# Patient Record
Sex: Female | Born: 1970 | Race: Black or African American | Hispanic: No | Smoking: Never smoker
Health system: Southern US, Community
[De-identification: ages and names within clinical notes are randomized; demographics above are authoritative.]

## PROBLEM LIST (undated history)

## (undated) DIAGNOSIS — T7840XA Allergy, unspecified, initial encounter: Secondary | ICD-10-CM

## (undated) DIAGNOSIS — R51 Headache: Secondary | ICD-10-CM

## (undated) DIAGNOSIS — R519 Headache, unspecified: Secondary | ICD-10-CM

## (undated) HISTORY — DX: Headache: R51

## (undated) HISTORY — PX: ABDOMINAL HYSTERECTOMY: SHX81

## (undated) HISTORY — DX: Headache, unspecified: R51.9

## (undated) HISTORY — DX: Allergy, unspecified, initial encounter: T78.40XA

---

## 1996-02-08 HISTORY — PX: ECTOPIC PREGNANCY SURGERY: SHX613

## 1998-02-07 HISTORY — PX: TUBAL LIGATION: SHX77

## 2001-08-28 ENCOUNTER — Emergency Department (HOSPITAL_COMMUNITY): Admission: EM | Admit: 2001-08-28 | Discharge: 2001-08-28 | Payer: Self-pay | Admitting: *Deleted

## 2004-12-14 ENCOUNTER — Emergency Department (HOSPITAL_COMMUNITY): Admission: EM | Admit: 2004-12-14 | Discharge: 2004-12-14 | Payer: Self-pay | Admitting: Gastroenterology

## 2014-10-10 ENCOUNTER — Emergency Department (HOSPITAL_COMMUNITY): Payer: Self-pay

## 2014-10-10 ENCOUNTER — Emergency Department (HOSPITAL_COMMUNITY)
Admission: EM | Admit: 2014-10-10 | Discharge: 2014-10-10 | Disposition: A | Discharge status: Home / Self Care | Payer: Self-pay | Attending: Emergency Medicine | Admitting: Emergency Medicine

## 2014-10-10 ENCOUNTER — Encounter (HOSPITAL_COMMUNITY): Payer: Self-pay | Admitting: *Deleted

## 2014-10-10 DIAGNOSIS — N898 Other specified noninflammatory disorders of vagina: Secondary | ICD-10-CM | POA: Insufficient documentation

## 2014-10-10 DIAGNOSIS — R102 Pelvic and perineal pain unspecified side: Secondary | ICD-10-CM

## 2014-10-10 DIAGNOSIS — Z3202 Encounter for pregnancy test, result negative: Secondary | ICD-10-CM | POA: Insufficient documentation

## 2014-10-10 LAB — URINALYSIS, ROUTINE W REFLEX MICROSCOPIC
Bilirubin Urine: NEGATIVE
Glucose, UA: NEGATIVE mg/dL
Ketones, ur: NEGATIVE mg/dL
Nitrite: NEGATIVE
Protein, ur: NEGATIVE mg/dL
Specific Gravity, Urine: 1.013 (ref 1.005–1.030)
UROBILINOGEN UA: 0.2 mg/dL (ref 0.0–1.0)
pH: 6 (ref 5.0–8.0)

## 2014-10-10 LAB — URINE MICROSCOPIC-ADD ON

## 2014-10-10 LAB — WET PREP, GENITAL
Trich, Wet Prep: NONE SEEN
Yeast Wet Prep HPF POC: NONE SEEN

## 2014-10-10 LAB — PREGNANCY, URINE: Preg Test, Ur: NEGATIVE

## 2014-10-10 MED ORDER — OXYCODONE-ACETAMINOPHEN 5-325 MG PO TABS
ORAL_TABLET | ORAL | Status: AC
  Filled 2014-10-10: qty 1

## 2014-10-10 MED ORDER — CEFTRIAXONE SODIUM 250 MG IJ SOLR
250.0000 mg | Freq: Once | INTRAMUSCULAR | Status: AC
  Administered 2014-10-10: 250 mg via INTRAMUSCULAR
  Filled 2014-10-10: qty 250

## 2014-10-10 MED ORDER — LIDOCAINE HCL 2 % IJ SOLN
5.0000 mL | Freq: Once | INTRAMUSCULAR | Status: AC
  Administered 2014-10-10: 100 mg via INTRADERMAL
  Filled 2014-10-10: qty 20

## 2014-10-10 MED ORDER — METRONIDAZOLE 500 MG PO TABS: 500.0000 mg | ORAL_TABLET | Freq: Two times a day (BID) | ORAL | Status: DC

## 2014-10-10 MED ORDER — AZITHROMYCIN 250 MG PO TABS
1000.0000 mg | ORAL_TABLET | Freq: Once | ORAL | Status: AC
  Administered 2014-10-10: 1000 mg via ORAL
  Filled 2014-10-10: qty 4

## 2014-10-10 MED ORDER — OXYCODONE-ACETAMINOPHEN 5-325 MG PO TABS
1.0000 | ORAL_TABLET | Freq: Once | ORAL | Status: AC
  Administered 2014-10-10: 1 via ORAL

## 2014-10-10 MED ORDER — DOXYCYCLINE HYCLATE 100 MG PO CAPS: 100.0000 mg | ORAL_CAPSULE | Freq: Two times a day (BID) | ORAL | Status: DC

## 2014-10-10 NOTE — Discharge Instructions (Signed)
It was our pleasure to provide your ER care today - we hope that you feel better.  Take motrin or aleve as need for pain.  Take antibiotics (doxycycline and flagyl) as prescribed.  Do not drink alcohol when taking these medications.  Follow up with primary care doctor in the next few days for recheck if symptoms fail to improve/resolve.  Return to ER if worse, new symptoms, fevers, worsening or severe pain, persistent vomiting, other concern.  You were given pain medication in the ER - no driving for the next 4 hours.    Pelvic Pain Female pelvic pain can be caused by many different things and start from a variety of places. Pelvic pain refers to pain that is located in the lower half of the abdomen and between your hips. The pain may occur over a short period of time (acute) or may be reoccurring (chronic). The cause of pelvic pain may be related to disorders affecting the female reproductive organs (gynecologic), but it may also be related to the bladder, kidney stones, an intestinal complication, or muscle or skeletal problems. Getting help right away for pelvic pain is important, especially if there has been severe, sharp, or a sudden onset of unusual pain. It is also important to get help right away because some types of pelvic pain can be life threatening.  CAUSES  Below are only some of the causes of pelvic pain. The causes of pelvic pain can be in one of several categories.   Gynecologic.  Pelvic inflammatory disease.  Sexually transmitted infection.  Ovarian cyst or a twisted ovarian ligament (ovarian torsion).  Uterine lining that grows outside the uterus (endometriosis).  Fibroids, cysts, or tumors.  Ovulation.  Pregnancy.  Pregnancy that occurs outside the uterus (ectopic pregnancy).  Miscarriage.  Labor.  Abruption of the placenta or ruptured uterus.  Infection.  Uterine infection (endometritis).  Bladder infection.  Diverticulitis.  Miscarriage related  to a uterine infection (septic abortion).  Bladder.  Inflammation of the bladder (cystitis).  Kidney stone(s).  Gastrointestinal.  Constipation.  Diverticulitis.  Neurologic.  Trauma.  Feeling pelvic pain because of mental or emotional causes (psychosomatic).  Cancers of the bowel or pelvis. EVALUATION  Your caregiver will want to take a careful history of your concerns. This includes recent changes in your health, a careful gynecologic history of your periods (menses), and a sexual history. Obtaining your family history and medical history is also important. Your caregiver may suggest a pelvic exam. A pelvic exam will help identify the location and severity of the pain. It also helps in the evaluation of which organ system may be involved. In order to identify the cause of the pelvic pain and be properly treated, your caregiver may order tests. These tests may include:   A pregnancy test.  Pelvic ultrasonography.  An X-ray exam of the abdomen.  A urinalysis or evaluation of vaginal discharge.  Blood tests. HOME CARE INSTRUCTIONS   Only take over-the-counter or prescription medicines for pain, discomfort, or fever as directed by your caregiver.   Rest as directed by your caregiver.   Eat a balanced diet.   Drink enough fluids to make your urine clear or pale yellow, or as directed.   Avoid sexual intercourse if it causes pain.   Apply warm or cold compresses to the lower abdomen depending on which one helps the pain.   Avoid stressful situations.   Keep a journal of your pelvic pain. Write down when it started, where the pain  is located, and if there are things that seem to be associated with the pain, such as food or your menstrual cycle.  Follow up with your caregiver as directed.  SEEK MEDICAL CARE IF:  Your medicine does not help your pain.  You have abnormal vaginal discharge. SEEK IMMEDIATE MEDICAL CARE IF:   You have heavy bleeding from the  vagina.   Your pelvic pain increases.   You feel light-headed or faint.   You have chills.   You have pain with urination or blood in your urine.   You have uncontrolled diarrhea or vomiting.   You have a fever or persistent symptoms for more than 3 days.  You have a fever and your symptoms suddenly get worse.   You are being physically or sexually abused.  MAKE SURE YOU:  Understand these instructions.  Will watch your condition.  Will get help if you are not doing well or get worse. Document Released: 12/22/2003 Document Revised: 06/10/2013 Document Reviewed: 05/16/2011 [facility] Patient Information 2015 [place], [place]. This information is not intended to replace advice given to you by your health care provider. Make sure you discuss any questions you have with your health care provider.

## 2014-10-10 NOTE — ED Notes (Signed)
Pt reports lower abd pain x 2 days with nausea and vaginal discharge. Denies urinary symptoms.

## 2014-10-10 NOTE — ED Provider Notes (Addendum)
CSN: [id number]     Arrival date & time 10/10/14  1315 History   First MD Initiated Contact with Patient 10/10/14 1404     Chief Complaint  Patient presents with  . Vaginal Discharge  . Abdominal Pain     (Consider location/radiation/quality/duration/timing/severity/associated sxs/prior Treatment) Patient is a 44 y.o. female presenting with vaginal discharge and abdominal pain. The history is provided by the patient.  Vaginal Discharge Associated symptoms: abdominal pain   Associated symptoms: no dysuria, no fever and no vomiting   Abdominal Pain Associated symptoms: vaginal discharge   Associated symptoms: no chest pain, no chills, no constipation, no diarrhea, no dysuria, no fever, no hematuria, no shortness of breath, no sore throat, no vaginal bleeding and no vomiting   Patient c/o sharp, pinching pain across bilateral lower abdomen for the past couple days. Pain constant, moderate, non radiating, without specific exacerbating or alleviating factors. No fever or chills. No dysuria, frequency, or hematuria.  lnmp 1 week ago. Mild vaginal discharge currently, malodor.   Hx fibroids. No hx ovarian cysts or endometriosis. Normal appetite. Mild nausea earlier. No vomiting. Having normal bms, no diarrhea or constipation. No prior abd or pelvic surgery. +sexually active, no contrac x prior tubal ligation.        History reviewed. No pertinent past medical history. History reviewed. No pertinent past surgical history. History reviewed. No pertinent family history. Social History  Substance Use Topics  . Smoking status: Never Smoker   . Smokeless tobacco: None  . Alcohol Use: No   OB History    No data available     Review of Systems  Constitutional: Negative for fever and chills.  HENT: Negative for sore throat.   Eyes: Negative for redness.  Respiratory: Negative for shortness of breath.   Cardiovascular: Negative for chest pain.  Gastrointestinal: Positive for abdominal pain.  Negative for vomiting, diarrhea and constipation.  Endocrine: Negative for polyuria.  Genitourinary: Positive for vaginal discharge. Negative for dysuria, hematuria, flank pain and vaginal bleeding.  Musculoskeletal: Negative for back pain and neck pain.  Skin: Negative for rash.  Neurological: Negative for headaches.  Hematological: Does not bruise/bleed easily.  Psychiatric/Behavioral: Negative for confusion.      Allergies  Review of patient's allergies indicates no known allergies.  Home Medications   Prior to Admission medications   Not on File   BP 148/99 mmHg  Pulse 85  Temp(Src) 98.1 F (36.7 C) (Oral)  Resp 18  Ht 5\' 6"  (1.676 m)  Wt 198 lb (89.812 kg)  BMI 31.97 kg/m2  SpO2 100%  LMP 10/02/2014 Physical Exam  Constitutional: She appears well-developed and well-nourished. No distress.  HENT:  Mouth/Throat: Oropharynx is clear and moist.  Eyes: Conjunctivae are normal. No scleral icterus.  Neck: Neck supple. No tracheal deviation present.  Cardiovascular: Normal rate, regular rhythm, normal heart sounds and intact distal pulses.   Pulmonary/Chest: Effort normal and breath sounds normal. No respiratory distress.  Abdominal: Soft. Normal appearance and bowel sounds are normal. She exhibits no distension and no mass. There is tenderness. There is no rebound and no guarding.  Bilateral pelvic area tenderness. No rebound or guarding. No focal rlq tenderness.   Genitourinary:  No cva tenderness. Normal external genitalia. Moderate, whitish-yellow vaginal discharge w mild malodor. +cervicitis. Cervix closed. Mild-mod CMT.  no focal or unilateral adnexal masses/tenderness.   Musculoskeletal: She exhibits no edema.  Neurological: She is alert.  Skin: Skin is warm and dry. No rash noted. She is not diaphoretic.  Psychiatric: She has a normal mood and affect.  Nursing note and vitals reviewed.   ED Course  Procedures (including critical care time) Labs  Review  Results for orders placed or performed during the hospital encounter of 10/10/14  Wet prep, genital  Result Value Ref Range   Yeast Wet Prep HPF POC NONE SEEN NONE SEEN   Trich, Wet Prep NONE SEEN NONE SEEN   Clue Cells Wet Prep HPF POC FEW (A) NONE SEEN   WBC, Wet Prep HPF POC TOO NUMEROUS TO COUNT (A) NONE SEEN  Urinalysis, Routine w reflex microscopic (not at [facility])  Result Value Ref Range   Color, Urine YELLOW YELLOW   APPearance CLEAR CLEAR   Specific Gravity, Urine 1.013 1.005 - 1.030   pH 6.0 5.0 - 8.0   Glucose, UA NEGATIVE NEGATIVE mg/dL   Hgb urine dipstick TRACE (A) NEGATIVE   Bilirubin Urine NEGATIVE NEGATIVE   Ketones, ur NEGATIVE NEGATIVE mg/dL   Protein, ur NEGATIVE NEGATIVE mg/dL   Urobilinogen, UA 0.2 0.0 - 1.0 mg/dL   Nitrite NEGATIVE NEGATIVE   Leukocytes, UA SMALL (A) NEGATIVE  Urine microscopic-add on  Result Value Ref Range   Squamous Epithelial / LPF FEW (A) RARE   WBC, UA 7-10 <3 WBC/hpf   RBC / HPF 3-6 <3 RBC/hpf   Bacteria, UA FEW (A) RARE  Pregnancy, urine  Result Value Ref Range   Preg Test, Ur NEGATIVE NEGATIVE      I have personally reviewed and evaluated these images and lab results as part of my medical decision-making.    MDM   Labs.  Reviewed nursing notes and prior charts for additional history.   u preg neg.   Vaginal swabs sent. Rocephin im, zithromax po.  U/s pending.  Wet prep positive for clue cells and many wbc - will rx flagyl.   U/s pending - signed out to oncoming provider, Dr Maryan Rued, to check u/s result when back.    If u/s negative, would rx w doxy and flagyl.  If positive, tx accordingly.      Lajean Saver, MD 10/10/14 [phone]

## 2014-10-10 NOTE — ED Notes (Signed)
Pt off unit with US 

## 2014-10-14 LAB — GC/CHLAMYDIA PROBE AMP (~~LOC~~) NOT AT ARMC
CHLAMYDIA, DNA PROBE: NEGATIVE
Neisseria Gonorrhea: NEGATIVE

## 2015-12-22 ENCOUNTER — Other Ambulatory Visit: Payer: Self-pay | Admitting: Obstetrics and Gynecology

## 2015-12-22 LAB — HM PAP SMEAR

## 2015-12-23 LAB — CYTOLOGY - PAP

## 2015-12-25 ENCOUNTER — Other Ambulatory Visit: Payer: Self-pay | Admitting: Obstetrics and Gynecology

## 2015-12-25 DIAGNOSIS — R928 Other abnormal and inconclusive findings on diagnostic imaging of breast: Secondary | ICD-10-CM

## 2016-01-08 ENCOUNTER — Ambulatory Visit
Admission: RE | Admit: 2016-01-08 | Discharge: 2016-01-08 | Disposition: A | Discharge status: Home / Self Care | Payer: 59 | Source: Ambulatory Visit | Attending: Obstetrics and Gynecology | Admitting: Obstetrics and Gynecology

## 2016-01-08 DIAGNOSIS — R928 Other abnormal and inconclusive findings on diagnostic imaging of breast: Secondary | ICD-10-CM

## 2016-03-16 ENCOUNTER — Encounter: Payer: Self-pay | Admitting: Physician Assistant

## 2016-03-16 ENCOUNTER — Ambulatory Visit (INDEPENDENT_AMBULATORY_CARE_PROVIDER_SITE_OTHER): Payer: 59 | Admitting: Physician Assistant

## 2016-03-16 VITALS — BP 126/90 | HR 72 | Temp 98.1°F | Resp 20 | Ht 66.5 in | Wt 194.2 lb

## 2016-03-16 DIAGNOSIS — R110 Nausea: Secondary | ICD-10-CM

## 2016-03-16 DIAGNOSIS — R519 Headache, unspecified: Secondary | ICD-10-CM

## 2016-03-16 DIAGNOSIS — R42 Dizziness and giddiness: Secondary | ICD-10-CM

## 2016-03-16 DIAGNOSIS — R51 Headache: Secondary | ICD-10-CM

## 2016-03-16 MED ORDER — SUMATRIPTAN SUCCINATE 25 MG PO TABS: 25.0000 mg | ORAL_TABLET | Freq: Once | ORAL | 0 refills | Status: DC

## 2016-03-16 MED ORDER — MECLIZINE HCL 32 MG PO TABS
32.0000 mg | ORAL_TABLET | Freq: Three times a day (TID) | ORAL | 0 refills | Status: DC | PRN
Start: 2016-03-16 — End: 2016-03-16

## 2016-03-16 MED ORDER — MECLIZINE HCL 25 MG PO TABS: 25.0000 mg | ORAL_TABLET | Freq: Three times a day (TID) | ORAL | 0 refills | Status: DC | PRN

## 2016-03-16 MED ORDER — ONDANSETRON HCL 4 MG/2ML IJ SOLN
4.0000 mg | Freq: Once | INTRAMUSCULAR | Status: AC
  Administered 2016-03-16: 4 mg via INTRAMUSCULAR

## 2016-03-16 MED ORDER — ONDANSETRON HCL 4 MG PO TABS: 4.0000 mg | ORAL_TABLET | Freq: Three times a day (TID) | ORAL | 0 refills | Status: DC | PRN

## 2016-03-16 NOTE — Progress Notes (Addendum)
Subjective:    Patient ID: Tracey Taylor, female    DOB: 02/28/1970, 46 y.o.   MRN: [id number]  HPI  Tracey Taylor is a 46 y/o female who presents to establish care.  She is also here to talk about dizziness and headaches, which she has had on and off x 1-2 years. Her symptoms have gradually worsened since onset. The attacks occur about once a month and they last for about 1 day. Positions that worsen symptoms: any motion, bending over, head back, lying down, rolling in bed to the left, rolling in bed to the right, standing up and turning head. Previous workup/treatments: none. Associated ear symptoms: none. Associated CNS symptoms: none. Recent infections: upper respiratory infection around Christmas (>1 month ago.) Head trauma: denied. Drug ingestion: none. Noise exposure: no occupational exposure. Family history: Mom had a stroke at age 4. She states that the headaches worsen with each episode, and she describes it as "a lot of pressure." When she gets the HA she is sensitive to light and has nausea. Most recent episode happened yesterday, and she took Excedrin to help with her symptoms, which it did not provide any relief. She denies any changes with her vision. She wears glasses, recent prescription updated in April. She does sit in front of a computer all day, calls herself "a workaholic." She denies slurred speech, confusion, issues with balance, weakness, lightheadedness.  Review of Systems  Constitutional: Negative for chills, diaphoresis, fatigue and fever.  HENT: Negative for postnasal drip, sinus pain and sinus pressure.   Eyes: Negative for photophobia, pain and redness.  Respiratory: Negative for cough, shortness of breath and stridor.   Cardiovascular: Negative for chest pain and leg swelling.  Gastrointestinal: Positive for nausea. Negative for diarrhea and vomiting.  Genitourinary: Negative for menstrual problem.  Skin: Negative for color change and rash.  Neurological:  Positive for dizziness and headaches. Negative for seizures, syncope, facial asymmetry, speech difficulty, weakness, light-headedness and numbness.  Hematological: Negative for adenopathy.  Psychiatric/Behavioral: The patient is not nervous/anxious.    History reviewed. No pertinent past medical history.   Social History   Social History  . Marital status: Divorced    Spouse name: N/A  . Number of children: N/A  . Years of education: N/A   Occupational History  . Not on file.   Social History Main Topics  . Smoking status: Never Smoker  . Smokeless tobacco: Never Used  . Alcohol use No  . Drug use: No  . Sexual activity: Yes    Partners: Male    Birth control/ protection: None, Surgical   Other Topics Concern  . Not on file   Social History Narrative  . No narrative on file    Past Surgical History:  Procedure Laterality Date  . ECTOPIC PREGNANCY SURGERY N/A 1998   tube removed but does not remember which side  . TUBAL LIGATION  2000    Family History  Problem Relation Age of Onset  . Diabetes Mother   . Hypertension Mother   . Kidney disease Mother   . Cancer Father   . Diabetes Maternal Aunt     No Known Allergies  No current outpatient prescriptions on file prior to visit.   No current facility-administered medications on file prior to visit.     BP 126/90 (BP Location: Left Arm, Patient Position: Sitting, Cuff Size: Normal)   Pulse 72   Temp 98.1 F (36.7 C) (Oral)   Resp 20   Ht 5'  6.5" (1.689 m)   Wt 194 lb 4 oz (88.1 kg)   LMP 03/10/2016   SpO2 98%   BMI 30.88 kg/m       Objective:   Physical Exam  Constitutional: Vital signs are normal. She appears well-developed and well-nourished. She is cooperative.  Non-toxic appearance. She does not have a sickly appearance. She does not appear ill.  HENT:  Head: Normocephalic and atraumatic.  Right Ear: Tympanic membrane, external ear and ear canal normal. Tympanic membrane is not erythematous,  not retracted and not bulging.  Left Ear: Tympanic membrane, external ear and ear canal normal. Tympanic membrane is not erythematous, not retracted and not bulging.  Nose: Nose normal.  Mouth/Throat: Uvula is midline and mucous membranes are normal. No posterior oropharyngeal edema or posterior oropharyngeal erythema.  Eyes: Conjunctivae and EOM are normal. Pupils are equal, round, and reactive to light. Right eye exhibits normal extraocular motion and no nystagmus. Left eye exhibits normal extraocular motion and no nystagmus.  Cardiovascular: Normal rate, regular rhythm, normal heart sounds and intact distal pulses.   Pulmonary/Chest: Effort normal and breath sounds normal. No accessory muscle usage. No respiratory distress.  Neurological: She is alert. She has normal strength. No cranial nerve deficit or sensory deficit. She displays a negative Romberg sign. Coordination and gait normal. GCS eye subscore is 4. GCS verbal subscore is 5. GCS motor subscore is 6.  Skin: Skin is warm, dry and intact.  Nursing note and vitals reviewed.      Assessment & Plan:  Dizziness and Intractable headache, unspecified chronicity pattern, unspecified headache type Provided prescription of Meclizine for dizziness. Reviewed case with Dr. Briscoe Deutscher. Will provide trial of abortive migraine medicine, Imitrex, if symptoms recur. Given ongoing worsening of symptoms, lack of prior work-up, duration of symptoms and positive family history, will order referral to neurology and MRI. Patient in full agreement. Reviewed stroke precautions with patient, advised to ED if she develops any slurred speech, issues with balance, sudden severe headache, vomiting, confusion, or weakness, please go to the Emergency Room immediately. - Ambulatory referral to Neurology - [place]; Future - Comprehensive metabolic panel; Future - CBC with Differential/Platelet; Future - TSH; Future  Nausea Received IV injection of  ondansetron (ZOFRAN) injection 4 mg; Inject 2 mLs (4 mg total) into the muscle once. Provided prescription of 4mg  zofran #20 tablets.  Also encouraged patient to make an appointment for a physical in approximately 1 month.  I spent 45 minutes with this patient, greater than 50% was face-to-face time counseling regarding the above diagnoses.   Inda Coke PA-C 03/16/16

## 2016-03-16 NOTE — Patient Instructions (Addendum)
It was great meeting you today!  Please schedule a physical in 1 month, and we will follow-up with your symptoms at that time as well. Please return to [place] sooner if your headaches worsen.  I have sent in a referral for neurology to evaluate and treat. Additionally, I am going to order an MRI for you.  If you develop any slurred speech, issues with balance, sudden severe headache, vomiting, confusion, or weakness, please go to the Emergency Room immediatley.   General Headache Without Cause A headache is pain or discomfort felt around the head or neck area. The specific cause of a headache may not be found. There are many causes and types of headaches. A few common ones are:  Tension headaches.  Migraine headaches.  Cluster headaches.  Chronic daily headaches. Follow these instructions at home: Watch your condition for any changes. Take these steps to help with your condition: Managing pain  Take over-the-counter and prescription medicines only as told by your health care provider.  Lie down in a dark, quiet room when you have a headache.  If directed, apply ice to the head and neck area:  Put ice in a plastic bag.  Place a towel between your skin and the bag.  Leave the ice on for 20 minutes, 2-3 times per day.  Use a heating pad or hot shower to apply heat to the head and neck area as told by your health care provider.  Keep lights dim if bright lights bother you or make your headaches worse. Eating and drinking  Eat meals on a regular schedule.  Limit alcohol use.  Decrease the amount of caffeine you drink, or stop drinking caffeine. General instructions  Keep all follow-up visits as told by your health care provider. This is important.  Keep a headache journal to help find out what may trigger your headaches. For example, write down:  What you eat and drink.  How much sleep you get.  Any change to your diet or medicines.  Try massage or other relaxation  techniques.  Limit stress.  Sit up straight, and do not tense your muscles.  Do not use tobacco products, including cigarettes, chewing tobacco, or e-cigarettes. If you need help quitting, ask your health care provider.  Exercise regularly as told by your health care provider.  Sleep on a regular schedule. Get 7-9 hours of sleep, or the amount recommended by your health care provider. Contact a health care provider if:  Your symptoms are not helped by medicine.  You have a headache that is different from the usual headache.  You have nausea or you vomit.  You have a fever. Get help right away if:  Your headache becomes severe.  You have repeated vomiting.  You have a stiff neck.  You have a loss of vision.  You have problems with speech.  You have pain in the eye or ear.  You have muscular weakness or loss of muscle control.  You lose your balance or have trouble walking.  You feel faint or pass out.  You have confusion. This information is not intended to replace advice given to you by your health care provider. Make sure you discuss any questions you have with your health care provider. Document Released: 01/24/2005 Document Revised: 07/02/2015 Document Reviewed: 05/19/2014 Elsevier Interactive Patient Education  2017 [employer].

## 2016-03-16 NOTE — Progress Notes (Signed)
Pre visit review using our clinic review tool, if applicable. No additional management support is needed unless otherwise documented below in the visit note. 

## 2016-03-17 ENCOUNTER — Other Ambulatory Visit (INDEPENDENT_AMBULATORY_CARE_PROVIDER_SITE_OTHER): Payer: 59

## 2016-03-17 ENCOUNTER — Other Ambulatory Visit: Payer: Self-pay

## 2016-03-17 DIAGNOSIS — R519 Headache, unspecified: Secondary | ICD-10-CM

## 2016-03-17 DIAGNOSIS — R51 Headache: Secondary | ICD-10-CM

## 2016-03-17 DIAGNOSIS — R42 Dizziness and giddiness: Secondary | ICD-10-CM

## 2016-03-17 DIAGNOSIS — D509 Iron deficiency anemia, unspecified: Secondary | ICD-10-CM

## 2016-03-17 LAB — CBC WITH DIFFERENTIAL/PLATELET
BASOS ABS: 0 10*3/uL (ref 0.0–0.1)
Basophils Relative: 0.7 % (ref 0.0–3.0)
Eosinophils Absolute: 0.1 10*3/uL (ref 0.0–0.7)
Eosinophils Relative: 1.5 % (ref 0.0–5.0)
HEMATOCRIT: 33.8 % — AB (ref 36.0–46.0)
Hemoglobin: 11.2 g/dL — ABNORMAL LOW (ref 12.0–15.0)
LYMPHS PCT: 44.8 % (ref 12.0–46.0)
Lymphs Abs: 2.1 10*3/uL (ref 0.7–4.0)
MCHC: 33.1 g/dL (ref 30.0–36.0)
MCV: 71.3 fl — AB (ref 78.0–100.0)
MONOS PCT: 8.5 % (ref 3.0–12.0)
Monocytes Absolute: 0.4 10*3/uL (ref 0.1–1.0)
NEUTROS ABS: 2.1 10*3/uL (ref 1.4–7.7)
NEUTROS PCT: 44.5 % (ref 43.0–77.0)
Platelets: 241 10*3/uL (ref 150.0–400.0)
RBC: 4.74 Mil/uL (ref 3.87–5.11)
RDW: 19 % — ABNORMAL HIGH (ref 11.5–15.5)
WBC: 4.8 10*3/uL (ref 4.0–10.5)

## 2016-03-17 LAB — IBC PANEL
Iron: 21 ug/dL — ABNORMAL LOW (ref 42–145)
Saturation Ratios: 4.6 % — ABNORMAL LOW (ref 20.0–50.0)
TRANSFERRIN: 327 mg/dL (ref 212.0–360.0)

## 2016-03-17 LAB — COMPREHENSIVE METABOLIC PANEL
ALT: 21 U/L (ref 0–35)
AST: 20 U/L (ref 0–37)
Albumin: 3.5 g/dL (ref 3.5–5.2)
Alkaline Phosphatase: 54 U/L (ref 39–117)
BILIRUBIN TOTAL: 0.3 mg/dL (ref 0.2–1.2)
BUN: 11 mg/dL (ref 6–23)
CO2: 29 meq/L (ref 19–32)
Calcium: 8.3 mg/dL — ABNORMAL LOW (ref 8.4–10.5)
Chloride: 107 mEq/L (ref 96–112)
Creatinine, Ser: 0.86 mg/dL (ref 0.40–1.20)
GFR: 92.33 mL/min (ref >60.00)
Glucose, Bld: 86 mg/dL (ref 70–99)
POTASSIUM: 4.5 meq/L (ref 3.5–5.1)
Sodium: 141 mEq/L (ref 135–145)
Total Protein: 6.3 g/dL (ref 6.0–8.3)

## 2016-03-17 LAB — TSH: TSH: 2.35 u[IU]/mL (ref 0.35–4.50)

## 2016-03-17 LAB — FERRITIN: FERRITIN: 4.4 ng/mL — AB (ref 10.0–291.0)

## 2016-03-17 LAB — IRON: Iron: 21 ug/dL — ABNORMAL LOW (ref 42–145)

## 2016-03-17 NOTE — Addendum Note (Signed)
Addended by: Frutoso Chase A on: 03/17/2016 02:36 PM   Modules accepted: Orders

## 2016-03-18 ENCOUNTER — Other Ambulatory Visit: Payer: Self-pay | Admitting: Physician Assistant

## 2016-03-18 DIAGNOSIS — D509 Iron deficiency anemia, unspecified: Secondary | ICD-10-CM

## 2016-03-18 MED ORDER — FERROUS SULFATE 325 (65 FE) MG PO TABS: 325.0000 mg | ORAL_TABLET | Freq: Every day | ORAL | 3 refills | Status: DC

## 2016-03-21 ENCOUNTER — Telehealth: Payer: Self-pay | Admitting: Neurology

## 2016-03-21 ENCOUNTER — Encounter: Payer: Self-pay | Admitting: Neurology

## 2016-03-21 ENCOUNTER — Ambulatory Visit (INDEPENDENT_AMBULATORY_CARE_PROVIDER_SITE_OTHER): Payer: 59 | Admitting: Neurology

## 2016-03-21 VITALS — BP 143/95 | HR 76 | Ht 66.0 in | Wt 197.2 lb

## 2016-03-21 DIAGNOSIS — R42 Dizziness and giddiness: Secondary | ICD-10-CM | POA: Diagnosis not present

## 2016-03-21 DIAGNOSIS — G43819 Other migraine, intractable, without status migrainosus: Secondary | ICD-10-CM

## 2016-03-21 DIAGNOSIS — G44209 Tension-type headache, unspecified, not intractable: Secondary | ICD-10-CM | POA: Diagnosis not present

## 2016-03-21 MED ORDER — SUMATRIPTAN SUCCINATE 50 MG PO TABS: 50.0000 mg | ORAL_TABLET | Freq: Once | ORAL | 1 refills | Status: DC

## 2016-03-21 MED ORDER — TIZANIDINE HCL 2 MG PO CAPS: 2.0000 mg | ORAL_CAPSULE | Freq: Three times a day (TID) | ORAL | 1 refills | Status: DC

## 2016-03-21 NOTE — Progress Notes (Signed)
[facility] [address] [place]. [place] [address] [phone]       OFFICE CONSULT NOTE  [name] Date of Birth:  [birth year] Medical Record Number:  [id number]   Referring MD:  Inda Coke Reason for Referral:  Headache  HPI: [name] is a 46 year old pleasant African-American lady who was been having increasing frequency and worsening headaches for the last 1 year. She describes the headaches as starting with a feeling of dizziness and nausea followed by a frontal and occasionally occipital headache. Headache is moderate to severe in intensity andfluctuates from 6-10/10. Headache is a combined been nausea and photophobia or phonophobia and increase his physical activity. She describes tightness in his posterior neck region mostly. She has received Zofran injections for headaches in the past which have helped. She is currently getting Imitrex 25 mg which does help a little but does not get rid of the headache. She's also been prescribed meclizine and Zofran for nausea and dizziness which helped somewhat. Headaches are currently occurring about twice a month or so. She is unable to identifyspecific triggers.  She said she did have some perimenstrual migraines in the past but they are not as frequent or severe. She has never been a long-term migraine abortive or prophylactic medications. She does have an MRI scan of the brain ordered for February 21. She denied any significant stress cells at present. She denies any loss of vision, double vision, focal extremity weakness and numbness. She has no family history of migraine headaches. She denies significant head injury with loss of consciousness, neck pain or motor vehicle accident. ROS:   14 system review of systems is positive for headache, dizziness, neck tightness, feeling hot and all other systems negative  PMH:  Past Medical History:  Diagnosis Date  . Headache     Social History:  Social  History   Social History  . Marital status: Divorced    Spouse name: N/A  . Number of children: N/A  . Years of education: N/A   Occupational History  . Not on file.   Social History Main Topics  . Smoking status: Never Smoker  . Smokeless tobacco: Never Used  . Alcohol use 0.6 oz/week    1 Glasses of wine per week     Comment: socially  . Drug use: No  . Sexual activity: Yes    Partners: Male    Birth control/ protection: None, Surgical   Other Topics Concern  . Not on file   Social History Narrative  . No narrative on file    Medications:   Current Outpatient Prescriptions on File Prior to Visit  Medication Sig Dispense Refill  . Aspirin-Acetaminophen-Caffeine (EXCEDRIN EXTRA STRENGTH PO) Take 2 tablets by mouth as needed.    . ferrous sulfate 325 (65 FE) MG tablet Take 1 tablet (325 mg total) by mouth daily with breakfast. 30 tablet 3  . ibuprofen (ADVIL,MOTRIN) 200 MG tablet Take 400 mg by mouth as needed.    . meclizine (ANTIVERT) 25 MG tablet Take 1 tablet (25 mg total) by mouth 3 (three) times daily as needed for dizziness. 30 tablet 0  . ondansetron (ZOFRAN) 4 MG tablet Take 1 tablet (4 mg total) by mouth every 8 (eight) hours as needed for nausea or vomiting. 20 tablet 0   No current facility-administered medications on file prior to visit.     Allergies:  No Known Allergies  Physical Exam General: well developed, well nourished Young African-American lady, seated,  in no evident distress Head: head normocephalic and atraumatic.   Neck: supple with no carotid or supraclavicular bruits Cardiovascular: regular rate and rhythm, no murmurs Musculoskeletal: no deformity. Mild spasm posterior neck muscles Skin:  no rash/petichiae Vascular:  Normal pulses all extremities  Neurologic Exam Mental Status: Awake and fully alert. Oriented to place and time. Recent and remote memory intact. Attention span, concentration and fund of knowledge appropriate. Mood and  affect appropriate.  Cranial Nerves: Fundoscopic exam reveals sharp disc margins. Pupils equal, briskly reactive to light. Extraocular movements full without nystagmus. Visual fields full to confrontation. Hearing intact. Facial sensation intact. Face, tongue, palate moves normally and symmetrically.  Motor: Normal bulk and tone. Normal strength in all tested extremity muscles. Sensory.: intact to touch , pinprick , position and vibratory sensation.  Coordination: Rapid alternating movements normal in all extremities. Finger-to-nose and heel-to-shin performed accurately bilaterally. Gait and Station: Arises from chair without difficulty. Stance is normal. Gait demonstrates normal stride length and balance . Able to heel, toe and tandem walk without difficulty.  Reflexes: 1+ and symmetric. Toes downgoing.       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 Recurrent episodes of transient dizziness and vertigo raising concern for structural or vascular brain etiology as well    PLAN: I had a long discussion with the patient with regards to her recurrent episodes of headaches with nausea and dizziness possibly representing mixed migraine headaches with tension features. Given presence of occasional vertigo and dizziness evaluation for vertebrobasilar insufficiency or structural brainstem lesions is also necessary. Check MRA of the brain, neck and MRI of the brain with and without contrast. Zanaflex 2 mg at night to help with her muscle tension headache component. Increase dose of Imitrex to 50 mg for symptomatic relief. I also advised her to do regular neck stretching exercises. Greater than 50% time during this 45 minute consultation visit was spent on counseling and coordination of care about her headaches, dizziness, discussion of plan for evaluation and treatment and answering  questions She will return for follow-up in 2 months or call earlier if necessary. Antony Contras, MD  [facility] [address] [place], [place] [address]  Phone [phone] Fax [phone] Note: This document was prepared with digital dictation and possible smart phrase technology. Any transcriptional errors that result from this process are unintentional.

## 2016-03-21 NOTE — Patient Instructions (Addendum)
I had a long discussion with the patient with regards to her recurrent episodes of headaches with nausea and dizziness possibly representing mixed migraine headaches with tension features. Given presence of occasional vertigo and dizziness evaluation for vertebrobasilar insufficiency or structural brainstem lesions is also necessary. Check MRA of the brain, neck and MRI of the brain with and without contrast. Zanaflex 2 mg at night to help with her muscle tension headache component. Increase dose of Imitrex to 50 mg for symptomatic relief. I also advised her to do regular neck stretching exercises. She will return for follow-up in 2 months or call earlier if necessary.   Tension Headache A tension headache is a feeling of pain, pressure, or aching that is often felt over the front and sides of the head. The pain can be dull, or it can feel tight (constricting). Tension headaches are not normally associated with nausea or vomiting, and they do not get worse with physical activity. Tension headaches can last from 30 minutes to several days. This is the most common type of headache. CAUSES The exact cause of this condition is not known. Tension headaches often begin after stress, anxiety, or depression. Other triggers may include:  Alcohol.  Too much caffeine, or caffeine withdrawal.  Respiratory infections, such as colds, flu, or sinus infections.  Dental problems or teeth clenching.  Fatigue.  Holding your head and neck in the same position for a long period of time, such as while using a computer.  Smoking. SYMPTOMS Symptoms of this condition include:  A feeling of pressure around the head.  Dull, aching head pain.  Pain felt over the front and sides of the head.  Tenderness in the muscles of the head, neck, and shoulders. DIAGNOSIS This condition may be diagnosed based on your symptoms and a physical exam. Tests may be done, such as a CT scan or an MRI of your head. These tests may  be done if your symptoms are severe or unusual. TREATMENT This condition may be treated with lifestyle changes and medicines to help relieve symptoms. HOME CARE INSTRUCTIONS Managing Pain   Take over-the-counter and prescription medicines only as told by your health care provider.  Lie down in a dark, quiet room when you have a headache.  If directed, apply ice to the head and neck area:  Put ice in a plastic bag.  Place a towel between your skin and the bag.  Leave the ice on for 20 minutes, 2-3 times per day.  Use a heating pad or a hot shower to apply heat to the head and neck area as told by your health care provider. Eating and Drinking   Eat meals on a regular schedule.  Limit alcohol use.  Decrease your caffeine intake, or stop using caffeine. General Instructions   Keep all follow-up visits as told by your health care provider. This is important.  Keep a headache journal to help find out what may trigger your headaches. For example, write down:  What you eat and drink.  How much sleep you get.  Any change to your diet or medicines.  Try massage or other relaxation techniques.  Limit stress.  Sit up straight, and avoid tensing your muscles.  Do not use tobacco products, including cigarettes, chewing tobacco, or e-cigarettes. If you need help quitting, ask your health care provider.  Exercise regularly as told by your health care provider.  Get 7-9 hours of sleep, or the amount recommended by your health care provider. [place]  CARE IF:  Your symptoms are not helped by medicine.  You have a headache that is different from what you normally experience.  You have nausea or you vomit.  You have a fever. SEEK IMMEDIATE MEDICAL CARE IF:  Your headache becomes severe.  You have repeated vomiting.  You have a stiff neck.  You have a loss of vision.  You have problems with speech.  You have pain in your eye or ear.  You have muscular weakness  or loss of muscle control.  You lose your balance or you have trouble walking.  You feel faint or you pass out.  You have confusion. This information is not intended to replace advice given to you by your health care provider. Make sure you discuss any questions you have with your health care provider. Document Released: 01/24/2005 Document Revised: 10/15/2014 Document Reviewed: 05/19/2014 Elsevier Interactive Patient Education  2017 [employer].

## 2016-03-21 NOTE — Telephone Encounter (Signed)
Please change tizandine to tablet per patients insurance.

## 2016-03-21 NOTE — Telephone Encounter (Signed)
[facility] [phone] called wanting to know if tizanidine (ZANAFLEX) 2 MG capsule could be changed to tablets, this is preferred by her insurance

## 2016-03-22 ENCOUNTER — Other Ambulatory Visit: Payer: Self-pay | Admitting: Neurology

## 2016-03-22 ENCOUNTER — Telehealth: Payer: Self-pay | Admitting: Neurology

## 2016-03-22 DIAGNOSIS — I639 Cerebral infarction, unspecified: Principal | ICD-10-CM

## 2016-03-22 DIAGNOSIS — G43611 Persistent migraine aura with cerebral infarction, intractable, with status migrainosus: Secondary | ICD-10-CM

## 2016-03-22 MED ORDER — TIZANIDINE HCL 2 MG PO TABS: 2.0000 mg | ORAL_TABLET | Freq: Every day | ORAL | Status: DC

## 2016-03-22 NOTE — Telephone Encounter (Signed)
Zanaflex change to tablets order.

## 2016-03-22 NOTE — Telephone Encounter (Signed)
[employer] did not approve the MRA's. The case numbers are [id number] & [id number]. The phone number for the peer to peer is [phone]. Thank you for your help.

## 2016-03-22 NOTE — Telephone Encounter (Signed)
Okay to do so? 

## 2016-03-24 ENCOUNTER — Other Ambulatory Visit: Payer: Self-pay

## 2016-03-24 ENCOUNTER — Telehealth: Payer: Self-pay | Admitting: Neurology

## 2016-03-24 DIAGNOSIS — G43819 Other migraine, intractable, without status migrainosus: Secondary | ICD-10-CM

## 2016-03-24 MED ORDER — TIZANIDINE HCL 2 MG PO TABS: 2.0000 mg | ORAL_TABLET | Freq: Every day | ORAL | Status: DC

## 2016-03-24 MED ORDER — TIZANIDINE HCL 2 MG PO TABS: 2.0000 mg | ORAL_TABLET | Freq: Every day | ORAL | 1 refills | Status: DC

## 2016-03-24 NOTE — Telephone Encounter (Signed)
Medication done for zanalex in tablet form.

## 2016-03-24 NOTE — Telephone Encounter (Signed)
Noted  

## 2016-03-24 NOTE — Telephone Encounter (Signed)
I spoke to nurse reviewer and got approval for both studies. MRA neck w/wo # A [id number] and MRA brain wo # A [id number]  Both expire 05/08/16

## 2016-03-24 NOTE — Telephone Encounter (Signed)
Lee at [employer] will not pay for tiZANidine (ZANAFLEX) tablet 2 mg capsules but will pay for tablets. Can Rx be changed to tablets?

## 2016-03-25 ENCOUNTER — Telehealth: Payer: Self-pay | Admitting: Physician Assistant

## 2016-03-25 NOTE — Telephone Encounter (Signed)
Please call patient back at [phone] . She has questions about MRI.   Thank you

## 2016-03-25 NOTE — Telephone Encounter (Signed)
Spoke to pt, asked her how I could help her. Pt said she is scheduled for MRI Brain on 2/23 but has seen Neurology and now he wants to order MRI Head and Neck and she can not afford all of these to be done and wants to know if she has to have MRI Brain? Told pt I will discuss with Aldona Bar and get back to her. Pt verbalized understanding.  Called pt back, told her I discussed with Aldona Bar and what she ordered is MRI of the Brain which looks at brain tissue and what Neuro has ordered is MRA of Neck and Head which looks at the vessels. If you do not want to have the tests done due to cost would suggest you call Neurology and see which he feels you should have done. Pt verbalized understanding.

## 2016-03-30 ENCOUNTER — Ambulatory Visit
Admission: RE | Admit: 2016-03-30 | Discharge: 2016-03-30 | Disposition: A | Discharge status: Home / Self Care | Payer: 59 | Source: Ambulatory Visit | Attending: Physician Assistant | Admitting: Physician Assistant

## 2016-03-30 DIAGNOSIS — R519 Headache, unspecified: Secondary | ICD-10-CM

## 2016-03-30 DIAGNOSIS — R51 Headache: Secondary | ICD-10-CM

## 2016-03-30 DIAGNOSIS — R42 Dizziness and giddiness: Secondary | ICD-10-CM

## 2016-03-30 MED ORDER — GADOBENATE DIMEGLUMINE 529 MG/ML IV SOLN
18.0000 mL | Freq: Once | INTRAVENOUS | Status: AC | PRN
  Administered 2016-03-30: 18 mL via INTRAVENOUS

## 2016-03-30 NOTE — Progress Notes (Signed)
After dressing pt returned to nursing area to sit for a few minutes to make sure no worsening of symptoms. Pt states she has less itching at present. IV d/csd.

## 2016-03-30 NOTE — Progress Notes (Signed)
Dr. Kris Hartmann into speak to pt. No shortness of breath noted. 1 hive noted on right shoulder.

## 2016-03-31 ENCOUNTER — Telehealth: Payer: Self-pay | Admitting: Physician Assistant

## 2016-03-31 ENCOUNTER — Other Ambulatory Visit: Payer: Self-pay | Admitting: Neurology

## 2016-03-31 ENCOUNTER — Telehealth: Payer: Self-pay | Admitting: Neurology

## 2016-03-31 ENCOUNTER — Telehealth: Payer: Self-pay | Admitting: *Deleted

## 2016-03-31 DIAGNOSIS — G450 Vertebro-basilar artery syndrome: Secondary | ICD-10-CM

## 2016-03-31 NOTE — Telephone Encounter (Signed)
-----   Message from [place], [place] sent at 03/31/2016  8:16 AM EST ----- Can we fax this to Dr. Garvin Fila nurse? I would like to make sure she has a follow-up appointment scheduled with him. Thanks.

## 2016-03-31 NOTE — Telephone Encounter (Signed)
See other message

## 2016-03-31 NOTE — Telephone Encounter (Signed)
MRI result faxed to Dr. Garvin Fila office at [phone].

## 2016-03-31 NOTE — Telephone Encounter (Signed)
Patient calling back about lab results. Please return call. Thank you.

## 2016-03-31 NOTE — Telephone Encounter (Signed)
Change order to MRA of the neck without contrast instead

## 2016-03-31 NOTE — Telephone Encounter (Signed)
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  I have forwarded the results to the neurologist as well as his nurse so they are aware of the findings. Aldona Bar recommends any further questions or concerns about this discuss with Dr. Leonie Man. Asked pt if she called his office but the other imaging ordered by him? Pt said no was waiting for MRI to be done. Told pt okay now that he has the results you need to call and discuss whether other imaging is needed that he ordered. Pt verbalized understanding.

## 2016-03-31 NOTE — Telephone Encounter (Deleted)
gg

## 2016-03-31 NOTE — Telephone Encounter (Addendum)
Tracey Taylor with [facility] just called me and informed me that the patient had a allergic reaction to the dye with the MRI brain that she had done yesterday.Tracey Taylor saw that she has a MRA neck w/wo contrast that was order and didn't know if you wanted the patient to have the exam and do the 13 hour prep which is 50 mg of prednisone during hour 13, 7 & 1 also at the 1 hour mark take 50 mg benadryl as well... If not GI mention something about getting a ultra sound done instead.

## 2016-03-31 NOTE — Telephone Encounter (Deleted)
Benadryl

## 2016-03-31 NOTE — Telephone Encounter (Signed)
I informed Tracey Taylor that Dr. Leonie Man was switching the order to wo contrast and they are going to call the patient and schedule the exams.

## 2016-03-31 NOTE — Telephone Encounter (Deleted)
jjjj

## 2016-03-31 NOTE — Telephone Encounter (Signed)
I attempted to call the patient to discuss her MRI results. Please tell her that her MRI showed possible "idiopathic intracranial hypertension." This causes pressure inside of the skull and could be contributing to her symptoms. I have forwarded the results to the neurologist as well as his nurse so they are aware of the findings. I recommend any further questions or concerns about this with Dr. Leonie Man or one of his colleagues. You can also call them to inquire about whether or not you need the further imaging we discussed.

## 2016-04-07 ENCOUNTER — Inpatient Hospital Stay: Admission: RE | Admit: 2016-04-07 | Payer: 59 | Source: Ambulatory Visit

## 2016-04-07 ENCOUNTER — Other Ambulatory Visit: Payer: 59

## 2016-05-18 ENCOUNTER — Ambulatory Visit: Payer: 59 | Admitting: Neurology

## 2017-02-22 ENCOUNTER — Encounter: Payer: Self-pay | Admitting: Physical Therapy

## 2017-03-07 ENCOUNTER — Encounter: Payer: Self-pay | Admitting: Family Medicine

## 2017-03-07 ENCOUNTER — Ambulatory Visit: Payer: 59 | Admitting: Family Medicine

## 2017-03-07 VITALS — BP 128/82 | HR 68 | Temp 98.9°F | Ht 66.0 in | Wt 197.0 lb

## 2017-03-07 DIAGNOSIS — J069 Acute upper respiratory infection, unspecified: Secondary | ICD-10-CM | POA: Insufficient documentation

## 2017-03-07 MED ORDER — PREDNISONE 5 MG PO TABS: ORAL_TABLET | ORAL | 0 refills | Status: DC

## 2017-03-07 NOTE — Assessment & Plan Note (Signed)
Symptoms likely viral in nature.  - counseled on supportive care  - prednisone.  - f/u PRN

## 2017-03-07 NOTE — Patient Instructions (Signed)
Please try things such as zyrtec-D or allegra-D which is an antihistamine and decongestant.   Please try afrin which will help with nasal congestion but use for only three days.   Please also try using a netti pot on a regular occasion.  Honey can help with a sore throat.   Delsym can help with cough

## 2017-03-07 NOTE — Progress Notes (Signed)
Tracey Taylor - 47 y.o. female MRN [id number]  Date of birth: [birth year]  SUBJECTIVE:  Including CC & ROS.  Chief Complaint  Patient presents with  . Nasal Congestion    Tracey Taylor is a 47 y.o. female that is presenting with congestion cough. She flew  to [place] this weekend and started feeling congested on Sunday. Denies fever, body aches and chills. She has been taking Mucinex and Tylenol with no improvement. Her father and sister have had similar symptoms. Feels like her symptoms are staying the same.   Review of Systems  Constitutional: Negative for fever.  HENT: Positive for rhinorrhea and sinus pain.   Respiratory: Positive for cough.   Cardiovascular: Negative for chest pain.  Gastrointestinal: Negative for abdominal pain.  Musculoskeletal: Negative for back pain.  Skin: Negative for color change.  Neurological: Negative for weakness.  Hematological: Negative for adenopathy.  Psychiatric/Behavioral: Negative for agitation.    HISTORY: Past Medical, Surgical, Social, and Family History Reviewed & Updated per EMR.   Pertinent Historical Findings include:  Past Medical History:  Diagnosis Date  . Headache     Past Surgical History:  Procedure Laterality Date  . ECTOPIC PREGNANCY SURGERY N/A 1998   tube removed but does not remember which side  . TUBAL LIGATION  2000    Allergies  Allergen Reactions  . Gadolinium Derivatives Hives and Itching    Itching and one hive on right deltoid area.      Family History  Problem Relation Age of Onset  . Diabetes Mother   . Hypertension Mother   . Kidney disease Mother   . Stroke Mother   . Cancer Father   . Diabetes Maternal Aunt      Social History   Socioeconomic History  . Marital status: Divorced    Spouse name: Not on file  . Number of children: Not on file  . Years of education: Not on file  . Highest education level: Not on file  Social Needs  . Financial resource strain: Not on  file  . Food insecurity - worry: Not on file  . Food insecurity - inability: Not on file  . Transportation needs - medical: Not on file  . Transportation needs - non-medical: Not on file  Occupational History  . Not on file  Tobacco Use  . Smoking status: Never Smoker  . Smokeless tobacco: Never Used  Substance and Sexual Activity  . Alcohol use: Yes    Alcohol/week: 0.6 oz    Types: 1 Glasses of wine per week    Comment: socially  . Drug use: No  . Sexual activity: Yes    Partners: Male    Birth control/protection: None, Surgical  Other Topics Concern  . Not on file  Social History Narrative  . Not on file     PHYSICAL EXAM:  VS: BP 128/82 (BP Location: Left Arm, Patient Position: Sitting, Cuff Size: Normal)   Pulse 68   Temp 98.9 F (37.2 C) (Oral)   Ht 5\' 6"  (1.676 m)   Wt 197 lb (89.4 kg)   SpO2 99%   BMI 31.80 kg/m  Physical Exam Gen: NAD, alert, cooperative with exam, well-appearing ENT: normal lips, normal nasal mucosa, tympanic membranes clear and intact bilaterally, normal oropharynx, no cervical lymphadenopathy Eye: normal EOM, normal conjunctiva and lids CV:  no edema, +2 pedal pulses, regular rate and rhythm, S1-S2   Resp: no accessory muscle use, non-labored, clear to auscultation bilaterally, no crackles or  wheezes Skin: no rashes, no areas of induration  Neuro: normal tone, normal sensation to touch Psych:  normal insight, alert and oriented MSK: Normal gait, normal strength       ASSESSMENT & PLAN:   Upper respiratory tract infection Symptoms likely viral in nature.  - counseled on supportive care  - prednisone.  - f/u PRN

## 2018-07-25 DIAGNOSIS — M25562 Pain in left knee: Secondary | ICD-10-CM | POA: Insufficient documentation

## 2019-02-20 ENCOUNTER — Ambulatory Visit: Payer: 59 | Attending: Internal Medicine

## 2019-02-20 DIAGNOSIS — Z20822 Contact with and (suspected) exposure to covid-19: Secondary | ICD-10-CM

## 2019-02-21 LAB — NOVEL CORONAVIRUS, NAA: SARS-CoV-2, NAA: DETECTED — AB

## 2019-03-01 ENCOUNTER — Ambulatory Visit: Payer: 59 | Attending: Internal Medicine

## 2019-03-01 DIAGNOSIS — Z20822 Contact with and (suspected) exposure to covid-19: Secondary | ICD-10-CM

## 2019-03-02 ENCOUNTER — Encounter: Payer: Self-pay | Admitting: Physician Assistant

## 2019-03-02 LAB — NOVEL CORONAVIRUS, NAA: SARS-CoV-2, NAA: DETECTED — AB

## 2019-03-04 ENCOUNTER — Encounter: Payer: Self-pay | Admitting: Physician Assistant

## 2019-03-04 ENCOUNTER — Ambulatory Visit (INDEPENDENT_AMBULATORY_CARE_PROVIDER_SITE_OTHER): Payer: 59 | Admitting: Physician Assistant

## 2019-03-04 DIAGNOSIS — U071 COVID-19: Secondary | ICD-10-CM

## 2019-03-04 NOTE — Progress Notes (Signed)
Virtual Visit via Video   I connected with Tracey Taylor on 03/04/19 at  3:40 PM EST by a video enabled telemedicine application and verified that I am speaking with the correct person using two identifiers. Location patient: Home Location provider: [facility], Office Persons participating in the virtual visit: Tracey Taylor, [place] PA-C, Tracey Pickler, LPN   I discussed the limitations of evaluation and management by telemedicine and the availability of in person appointments. The patient expressed understanding and agreed to proceed.  I acted as a Education administrator for [employer], PA-C [employer], LPN  Subjective:   HPI:   Patient is requesting evaluation for possible COVID-19.  Symptom onset: January 11th, pt was positive on 01/13 and positive again 03/01/2019  Travel/contacts: No travel  Patient endorses the following symptoms: sinus headache and myalgia  Patient denies the following symptoms: Fever (none), sinus congestion, sinus pain, rhinorrhea, ear pain, sore throat, shortness of breath, chest tightness and chest pain, severe HA or "worst HA of life"  Treatments tried: Aspirin 325 mg with some relief  She is eating and drinking well without any difficulty.  She does have reduced taste and smell.  She is having some intermittent dizziness, but does not feel like it is severe.  She states it may be associated with her sinus congestion.  Patient risk factors: Current [id number] risk of complications score: 0 Smoking status: Tracey Taylor  reports that she has never smoked. She has never used smokeless tobacco. If female, currently pregnant? []   Yes []   No  ROS: See pertinent positives and negatives per HPI.  Patient Active Problem List   Diagnosis Date Noted  . Upper respiratory tract infection 03/07/2017  . Tension headache 03/21/2016  . Migraine variant, intractable 03/21/2016  . Vertigo 03/21/2016  . Dizziness 03/21/2016      Social History   Tobacco Use  . Smoking status: Never Smoker  . Smokeless tobacco: Never Used  Substance Use Topics  . Alcohol use: Yes    Alcohol/week: 1.0 standard drinks    Types: 1 Glasses of wine per week    Comment: socially    Current Outpatient Medications:  .  aspirin 325 MG EC tablet, Take 325 mg by mouth as needed for pain., Disp: , Rfl:   Allergies  Allergen Reactions  . Gadolinium Derivatives Hives and Itching    Itching and one hive on right deltoid area.      Objective:   VITALS: Per patient if applicable, see vitals. GENERAL: Alert, appears well and in no acute distress. HEENT: Atraumatic, conjunctiva clear, no obvious abnormalities on inspection of external nose and ears. NECK: Normal movements of the head and neck. CARDIOPULMONARY: No increased WOB. Speaking in clear sentences. I:E ratio WNL.  MS: Moves all visible extremities without noticeable abnormality. PSYCH: Pleasant and cooperative, well-groomed. Speech normal rate and rhythm. Affect is appropriate. Insight and judgement are appropriate. Attention is focused, linear, and appropriate.  NEURO: CN grossly intact. Oriented as arrived to appointment on time with no prompting. Moves both UE equally.  SKIN: No obvious lesions, wounds, erythema, or cyanosis noted on face or hands.  Assessment and Plan:   Tracey Taylor was seen today for covid-19 symptoms.  Diagnoses and all orders for this visit:  COVID-19   Patient has a respiratory illness without signs of acute distress or respiratory compromise at this time. This is likely a viral infection, which can come from a number of respiratory viruses.   I discussed  trialing Flonase, or generic equivalent, for her symptoms.  Discussed worsening precautions of dizziness, and if her symptoms do worsen to let us know.  Push plenty of fluids.  No red flags on my discussion with her.   Advised if they experience a "second sickening" or worsening symptoms as the  illness progresses, they are to call the office for further instructions or seek emergent evaluation for any severe symptoms.   . Reviewed expectations re: course of current medical issues. . Discussed self-management of symptoms. . Outlined signs and symptoms indicating need for more acute intervention. . Patient verbalized understanding and all questions were answered. Marland Kitchen Health Maintenance issues including appropriate healthy diet, exercise, and smoking avoidance were discussed with patient. . See orders for this visit as documented in the electronic medical record.  I discussed the assessment and treatment plan with the patient. The patient was provided an opportunity to ask questions and all were answered. The patient agreed with the plan and demonstrated an understanding of the instructions.   The patient was advised to call back or seek an in-person evaluation if the symptoms worsen or if the condition fails to improve as anticipated.   CMA or LPN served as scribe during this visit. History, Physical, and Plan performed by medical provider. The above documentation has been reviewed and is accurate and complete.   [place], [place] 03/04/2019

## 2019-03-15 ENCOUNTER — Encounter: Payer: Self-pay | Admitting: Physician Assistant

## 2019-03-15 ENCOUNTER — Ambulatory Visit (INDEPENDENT_AMBULATORY_CARE_PROVIDER_SITE_OTHER): Payer: 59 | Admitting: Physician Assistant

## 2019-03-15 ENCOUNTER — Encounter: Payer: 59 | Admitting: Physician Assistant

## 2019-03-15 ENCOUNTER — Telehealth: Payer: Self-pay | Admitting: Physician Assistant

## 2019-03-15 VITALS — Ht 66.0 in | Wt 190.0 lb

## 2019-03-15 DIAGNOSIS — R1031 Right lower quadrant pain: Secondary | ICD-10-CM

## 2019-03-15 NOTE — Telephone Encounter (Signed)
Pt spoke with [facility] and was triaged by nurse for pain in lower abdomen and on right side. Nurse advised pt to see PCP within 24 hours. Pt was also advised to drink adequate fluids, eat a bland diet, avoid greasy or fatty foods. Nurse recommended pt take Bismuth Subsalicylate (Pepto-Bismol) for OTC Meds to help reduce abdominal cramping. Nurse advised pt to call back if severe pain lasts over 1 hour or constant pain lasts over 2 hours.

## 2019-03-15 NOTE — Telephone Encounter (Signed)
Please call pt and schedule virtual visit for problem.

## 2019-03-15 NOTE — Telephone Encounter (Signed)
LVM for patient to call back and schedule a Virtual visit with Aldona Bar.

## 2019-03-15 NOTE — Progress Notes (Signed)
   TELEPHONE ENCOUNTER   Patient verbally agreed to telephone visit and is aware that copayment and coinsurance may apply. Patient was treated using telemedicine according to accepted telemedicine protocols.  Location of the patient: home Location of provider: [place] Names of all persons participating in the telemedicine service and role in the encounter: Inda Coke, [place], Tracey Taylor (patient)  Subjective:   Chief Complaint  Patient presents with  . Abdominal Pain     HPI   Abdominal pain Pt c/o right sided abdominal pain and pain RLQ radiating to her right lower back. Burning sensation, pain is getting worse with time 7/10 this AM and now a 9/10. Denies any constipation.  Started a week ago and has gotten worse today. She is having some nausea and dizziness, headaches. Denies fever, chills, constipation or diarrhea. Pt said she is not sure if this is from Covid. Having some ongoing headaches.  States that if I asked her to jump up and down, it would cause worsening abdominal pain and headache.  Patient's last menstrual period was 02/26/2019 (approximate).    Patient Active Problem List   Diagnosis Date Noted  . Upper respiratory tract infection 03/07/2017  . Tension headache 03/21/2016  . Migraine variant, intractable 03/21/2016  . Vertigo 03/21/2016  . Dizziness 03/21/2016   Social History   Tobacco Use  . Smoking status: Never Smoker  . Smokeless tobacco: Never Used  Substance Use Topics  . Alcohol use: Yes    Alcohol/week: 1.0 standard drinks    Types: 1 Glasses of wine per week    Comment: socially    Current Outpatient Medications:  .  aspirin 325 MG EC tablet, Take 325 mg by mouth as needed for pain., Disp: , Rfl:  .  Aspirin-Acetaminophen-Caffeine (EXCEDRIN MIGRAINE PO), Take 1-2 tablets by mouth as needed., Disp: , Rfl:  Allergies  Allergen Reactions  . Gadolinium Derivatives Hives and Itching    Itching and one hive on right  deltoid area.      Assessment & Plan:   1. Abdominal pain, right lower quadrant   Although she is in no apparent distress over our phone call, her pain is significant per her report and is worsening. Concern for appendicitis vs ovarian cyst vs kidney stone vs other. Recommend that she seek urgent or emergent care at this time for further evaluation and management. She is agreeable to this and verbalizes understanding to plan.  No orders of the defined types were placed in this encounter.  No orders of the defined types were placed in this encounter.   Inda Coke, [place] 03/15/2019  Time spent with the patient: 8 minutes, spent in obtaining information about her symptoms, reviewing her previous labs, evaluations, and treatments, counseling her about her condition (please see the discussed topics above), and developing a plan to further investigate it; she had a number of questions which I addressed.

## 2019-03-16 ENCOUNTER — Encounter (HOSPITAL_COMMUNITY): Payer: Self-pay

## 2019-03-16 ENCOUNTER — Other Ambulatory Visit: Payer: Self-pay

## 2019-03-16 ENCOUNTER — Emergency Department (HOSPITAL_COMMUNITY): Payer: 59

## 2019-03-16 ENCOUNTER — Emergency Department (HOSPITAL_COMMUNITY)
Admission: EM | Admit: 2019-03-16 | Discharge: 2019-03-16 | Disposition: A | Discharge status: Home / Self Care | Payer: 59 | Attending: Emergency Medicine | Admitting: Emergency Medicine

## 2019-03-16 DIAGNOSIS — N899 Noninflammatory disorder of vagina, unspecified: Secondary | ICD-10-CM | POA: Diagnosis not present

## 2019-03-16 DIAGNOSIS — M79622 Pain in left upper arm: Secondary | ICD-10-CM | POA: Insufficient documentation

## 2019-03-16 DIAGNOSIS — R10813 Right lower quadrant abdominal tenderness: Secondary | ICD-10-CM | POA: Diagnosis not present

## 2019-03-16 DIAGNOSIS — R1030 Lower abdominal pain, unspecified: Secondary | ICD-10-CM | POA: Diagnosis present

## 2019-03-16 DIAGNOSIS — D259 Leiomyoma of uterus, unspecified: Secondary | ICD-10-CM | POA: Insufficient documentation

## 2019-03-16 LAB — URINALYSIS, ROUTINE W REFLEX MICROSCOPIC
Bacteria, UA: NONE SEEN
Bilirubin Urine: NEGATIVE
Glucose, UA: NEGATIVE mg/dL
Ketones, ur: NEGATIVE mg/dL
Leukocytes,Ua: NEGATIVE
Nitrite: NEGATIVE
Protein, ur: NEGATIVE mg/dL
Specific Gravity, Urine: 1.014 (ref 1.005–1.030)
pH: 5 (ref 5.0–8.0)

## 2019-03-16 LAB — WET PREP, GENITAL
Clue Cells Wet Prep HPF POC: NONE SEEN
Sperm: NONE SEEN
Trich, Wet Prep: NONE SEEN
Yeast Wet Prep HPF POC: NONE SEEN

## 2019-03-16 LAB — I-STAT BETA HCG BLOOD, ED (MC, WL, AP ONLY): I-stat hCG, quantitative: <5 m[IU]/mL (ref <5)

## 2019-03-16 LAB — CBC WITH DIFFERENTIAL/PLATELET
Abs Immature Granulocytes: 0.01 10*3/uL (ref 0.00–0.07)
Basophils Absolute: 0 10*3/uL (ref 0.0–0.1)
Basophils Relative: 1 %
Eosinophils Absolute: 0.1 10*3/uL (ref 0.0–0.5)
Eosinophils Relative: 2 %
HCT: 32.9 % — ABNORMAL LOW (ref 36.0–46.0)
Hemoglobin: 10.6 g/dL — ABNORMAL LOW (ref 12.0–15.0)
Immature Granulocytes: 0 %
Lymphocytes Relative: 46 %
Lymphs Abs: 2.9 10*3/uL (ref 0.7–4.0)
MCH: 22.8 pg — ABNORMAL LOW (ref 26.0–34.0)
MCHC: 32.2 g/dL (ref 30.0–36.0)
MCV: 70.8 fL — ABNORMAL LOW (ref 80.0–100.0)
Monocytes Absolute: 0.6 10*3/uL (ref 0.1–1.0)
Monocytes Relative: 9 %
Neutro Abs: 2.6 10*3/uL (ref 1.7–7.7)
Neutrophils Relative %: 42 %
Platelets: 219 10*3/uL (ref 150–400)
RBC: 4.65 MIL/uL (ref 3.87–5.11)
RDW: 19.4 % — ABNORMAL HIGH (ref 11.5–15.5)
WBC: 6.3 10*3/uL (ref 4.0–10.5)
nRBC: 0 % (ref 0.0–0.2)

## 2019-03-16 LAB — COMPREHENSIVE METABOLIC PANEL
ALT: 14 U/L (ref 0–44)
AST: 15 U/L (ref 15–41)
Albumin: 3.2 g/dL — ABNORMAL LOW (ref 3.5–5.0)
Alkaline Phosphatase: 40 U/L (ref 38–126)
Anion gap: 7 (ref 5–15)
BUN: 12 mg/dL (ref 6–20)
CO2: 23 mmol/L (ref 22–32)
Calcium: 8.7 mg/dL — ABNORMAL LOW (ref 8.9–10.3)
Chloride: 108 mmol/L (ref 98–111)
Creatinine, Ser: 0.64 mg/dL (ref 0.44–1.00)
GFR calc Af Amer: >60 mL/min (ref >60)
GFR calc non Af Amer: >60 mL/min (ref >60)
Glucose, Bld: 93 mg/dL (ref 70–99)
Potassium: 3.9 mmol/L (ref 3.5–5.1)
Sodium: 138 mmol/L (ref 135–145)
Total Bilirubin: 0.4 mg/dL (ref 0.3–1.2)
Total Protein: 6 g/dL — ABNORMAL LOW (ref 6.5–8.1)

## 2019-03-16 LAB — LIPASE, BLOOD: Lipase: 24 U/L (ref 11–51)

## 2019-03-16 LAB — TROPONIN I (HIGH SENSITIVITY)
Troponin I (High Sensitivity): 9 ng/L (ref <18)
Troponin I (High Sensitivity): <2 ng/L (ref <18)

## 2019-03-16 MED ORDER — IOHEXOL 300 MG/ML  SOLN
100.0000 mL | Freq: Once | INTRAMUSCULAR | Status: AC | PRN
  Administered 2019-03-16: 100 mL via INTRAVENOUS

## 2019-03-16 MED ORDER — SODIUM CHLORIDE 0.9 % IV BOLUS
1000.0000 mL | Freq: Once | INTRAVENOUS | Status: AC
  Administered 2019-03-16: 1000 mL via INTRAVENOUS

## 2019-03-16 MED ORDER — TRAMADOL HCL 50 MG PO TABS
50.0000 mg | ORAL_TABLET | Freq: Four times a day (QID) | ORAL | 0 refills | Status: AC | PRN
Start: 2019-03-16 — End: 2019-03-21

## 2019-03-16 MED ORDER — MORPHINE SULFATE (PF) 4 MG/ML IV SOLN
4.0000 mg | Freq: Once | INTRAVENOUS | Status: AC
  Administered 2019-03-16: 4 mg via INTRAVENOUS
  Filled 2019-03-16: qty 1

## 2019-03-16 MED ORDER — ONDANSETRON HCL 4 MG/2ML IJ SOLN
4.0000 mg | Freq: Once | INTRAMUSCULAR | Status: AC
  Administered 2019-03-16: 4 mg via INTRAVENOUS
  Filled 2019-03-16: qty 2

## 2019-03-16 NOTE — ED Triage Notes (Signed)
Pt reports right sided flank pain for the past week, denies urinary symptoms. Pt also reports left arm pain that radiates to her shoulder that started yesterday. Pt tested positive for COVID last month. Pt also having some nausea and feeling lightheaded, denies vomiting. Pt a.o

## 2019-03-16 NOTE — ED Provider Notes (Signed)
[facility] EMERGENCY DEPARTMENT Provider Note   CSN: [id number] Arrival date & time: 03/16/19  1716     History Chief Complaint  Patient presents with  . Abdominal Pain  . Arm Pain    Tracey Taylor is a 49 y.o. female.  This is a 49 year old female with past medical history of uterine fibroids.  She is a G3, P3.  She presents to the ED today for evaluation of 1 week history of right lower quadrant pain that is colicky in nature.  It radiates into her groin and lower back.  She denies any recent fever or chills.  Denies hematuria or vaginal discharge.  She does endorse nausea but no vomiting.  No similar symptoms in the past.  She is currently menstruating.  No significant history of abdominal surgeries.  She did have a tubal ligation following her last pregnancy.  Last Pap smear in 2017 was normal.  She also notes left upper arm pain that started today.  This radiates into her shoulder.  No aggravating or alleviating symptoms.  She denies chest pain or shortness of breath.  No radiation to the jaw.  Not exertionally related.  She denies previous history of cardiac conditions.     Past Medical History:  Diagnosis Date  . Headache     Patient Active Problem List   Diagnosis Date Noted  . Upper respiratory tract infection 03/07/2017  . Tension headache 03/21/2016  . Migraine variant, intractable 03/21/2016  . Vertigo 03/21/2016  . Dizziness 03/21/2016    Past Surgical History:  Procedure Laterality Date  . ECTOPIC PREGNANCY SURGERY N/A 1998   tube removed but does not remember which side  . TUBAL LIGATION  2000     OB History   No obstetric history on file.     Family History  Problem Relation Age of Onset  . Diabetes Mother   . Hypertension Mother   . Kidney disease Mother   . Stroke Mother   . Cancer Father   . Diabetes Maternal Aunt     Social History   Tobacco Use  . Smoking status: Never Smoker  . Smokeless tobacco: Never  Used  Substance Use Topics  . Alcohol use: Yes    Alcohol/week: 1.0 standard drinks    Types: 1 Glasses of wine per week    Comment: socially  . Drug use: No    Home Medications Prior to Admission medications   Medication Sig Start Date End Date Taking? Authorizing Provider  traMADol (ULTRAM) 50 MG tablet Take 1 tablet (50 mg total) by mouth every 6 (six) hours as needed for up to 5 days. 03/16/19 03/21/19  Mitzi Hansen, MD    Allergies    Gadolinium derivatives  Review of Systems   Review of Systems  Constitutional: Negative for chills and fever.  HENT: Negative.   Respiratory: Negative for chest tightness and shortness of breath.   Cardiovascular: Negative for chest pain.  Gastrointestinal: Positive for abdominal pain, diarrhea and nausea. Negative for vomiting.  Genitourinary: Positive for flank pain, pelvic pain and vaginal bleeding. Negative for dysuria and vaginal discharge.  Skin: Negative for rash.  Neurological: Negative for dizziness.  Psychiatric/Behavioral: Negative for confusion.    Physical Exam Updated Vital Signs BP 127/85 (BP Location: Right Arm)   Pulse 74   Temp 98 F (36.7 C) (Oral)   Resp 16   LMP 02/26/2019 (Approximate) Comment: Negative HCG in Ed today  SpO2 98%   Physical Exam Constitutional:  General: She is not in acute distress. HENT:     Head: Normocephalic.  Cardiovascular:     Rate and Rhythm: Normal rate and regular rhythm.  Pulmonary:     Effort: Pulmonary effort is normal.     Breath sounds: Normal breath sounds.  Abdominal:     General: Bowel sounds are normal. There is no distension.     Palpations: Abdomen is soft.     Tenderness: There is abdominal tenderness in the right lower quadrant. There is right CVA tenderness and rebound.  Genitourinary:    Vagina: Vaginal discharge and bleeding present. No tenderness.     Cervix: No discharge.     Uterus: Enlarged.   Skin:    General: Skin is warm and dry.  Neurological:       General: No focal deficit present.     Mental Status: She is alert.  Psychiatric:        Mood and Affect: Mood normal.     ED Results / Procedures / Treatments   Labs (all labs ordered are listed, but only abnormal results are displayed) Labs Reviewed  WET PREP, GENITAL - Abnormal; Notable for the following components:      Result Value   WBC, Wet Prep HPF POC FEW (*)    All other components within normal limits  CBC WITH DIFFERENTIAL/PLATELET - Abnormal; Notable for the following components:   Hemoglobin 10.6 (*)    HCT 32.9 (*)    MCV 70.8 (*)    MCH 22.8 (*)    RDW 19.4 (*)    All other components within normal limits  COMPREHENSIVE METABOLIC PANEL - Abnormal; Notable for the following components:   Calcium 8.7 (*)    Total Protein 6.0 (*)    Albumin 3.2 (*)    All other components within normal limits  URINALYSIS, ROUTINE W REFLEX MICROSCOPIC - Abnormal; Notable for the following components:   Color, Urine STRAW (*)    Hgb urine dipstick SMALL (*)    All other components within normal limits  LIPASE, BLOOD  I-STAT BETA HCG BLOOD, ED ([facility], [facility], [facility] ONLY)  GC/CHLAMYDIA PROBE AMP ([place]) NOT AT [facility]  TROPONIN I (HIGH SENSITIVITY)  TROPONIN I (HIGH SENSITIVITY)    EKG EKG Interpretation  Date/Time:  Saturday March 16 2019 18:25:50 EST Ventricular Rate:  73 PR Interval:    QRS Duration: 88 QT Interval:  361 QTC Calculation: 398 R Axis:   74 Text Interpretation: Sinus rhythm No significant change since last tracing Confirmed by Wandra Arthurs [phone]) on 03/16/2019 6:32:51 PM   Radiology US Transvaginal Non-OB  Result Date: 03/16/2019 CLINICAL DATA:  Right adnexal pain and tenderness EXAM: TRANSABDOMINAL AND TRANSVAGINAL ULTRASOUND OF PELVIS TECHNIQUE: Both transabdominal and transvaginal ultrasound examinations of the pelvis were performed. Transabdominal technique was performed for global imaging of the pelvis including uterus, ovaries, adnexal regions, and  pelvic cul-de-sac. It was necessary to proceed with endovaginal exam following the transabdominal exam to visualize the . COMPARISON:  None FINDINGS: Uterus Measurements: 16.9 x 8.0 x 11.2 = volume: 72 mL. There are multiple heterogeneously hypoechoic intrauterine fibroids seen throughout. The largest in the posterior right fundus measuring 7.1 x 6.2 x 7.3 cm. Endometrium Thickness: The endometrium is not well visualized. Right ovary Measurements: 4.8 x 3.0 x 3.6 cm = volume: 26 mL. Normal appearance/no adnexal mass. Left ovary Measurements: 4.2 x 1.9 x 2.5 cm = volume: 10 mL. Normal appearance/no adnexal mass. Other findings No abnormal free fluid.  IMPRESSION: Multiple intrauterine fibroids. The largest measures 7.1 x 6.2 x 7.3 cm in the posterior right fundus. Normal appearing ovaries. Electronically Signed   By: Prudencio Pair M.D.   On: 03/16/2019 22:13   US Pelvis Complete  Result Date: 03/16/2019 CLINICAL DATA:  Right adnexal pain and tenderness EXAM: TRANSABDOMINAL AND TRANSVAGINAL ULTRASOUND OF PELVIS TECHNIQUE: Both transabdominal and transvaginal ultrasound examinations of the pelvis were performed. Transabdominal technique was performed for global imaging of the pelvis including uterus, ovaries, adnexal regions, and pelvic cul-de-sac. It was necessary to proceed with endovaginal exam following the transabdominal exam to visualize the . COMPARISON:  None FINDINGS: Uterus Measurements: 16.9 x 8.0 x 11.2 = volume: 72 mL. There are multiple heterogeneously hypoechoic intrauterine fibroids seen throughout. The largest in the posterior right fundus measuring 7.1 x 6.2 x 7.3 cm. Endometrium Thickness: The endometrium is not well visualized. Right ovary Measurements: 4.8 x 3.0 x 3.6 cm = volume: 26 mL. Normal appearance/no adnexal mass. Left ovary Measurements: 4.2 x 1.9 x 2.5 cm = volume: 10 mL. Normal appearance/no adnexal mass. Other findings No abnormal free fluid. IMPRESSION: Multiple intrauterine fibroids.  The largest measures 7.1 x 6.2 x 7.3 cm in the posterior right fundus. Normal appearing ovaries. Electronically Signed   By: Prudencio Pair M.D.   On: 03/16/2019 22:13   CT ABDOMEN PELVIS W CONTRAST  Result Date: 03/16/2019 CLINICAL DATA:  Abdominal distention. Right-sided flank pain. EXAM: CT ABDOMEN AND PELVIS WITH CONTRAST TECHNIQUE: Multidetector CT imaging of the abdomen and pelvis was performed using the standard protocol following bolus administration of intravenous contrast. CONTRAST:  111mL OMNIPAQUE IOHEXOL 300 MG/ML  SOLN COMPARISON:  None. FINDINGS: Lower chest: Unremarkable. Hepatobiliary: No suspicious focal abnormality within the liver parenchyma. There is no evidence for gallstones, gallbladder wall thickening, or pericholecystic fluid. No intrahepatic or extrahepatic biliary dilation. Pancreas: No dilatation of the main duct. No intraparenchymal cyst. No peripancreatic edema. 4 mm low-density focus identified in the pancreatic tail (25/3) Spleen: No splenomegaly. No focal mass lesion. Adrenals/Urinary Tract: No adrenal nodule or mass. Kidneys unremarkable. No evidence for hydroureter. The urinary bladder appears normal for the degree of distention. Stomach/Bowel: Stomach is nondistended. Duodenum is normally positioned as is the ligament of Treitz. No small bowel wall thickening. No small bowel dilatation. The terminal ileum is normal. Appendix is well seen on sagittal 50/7 and has normal appearance. No gross colonic mass. No colonic wall thickening. Vascular/Lymphatic: No abdominal aortic aneurysm. There is no gastrohepatic or hepatoduodenal ligament lymphadenopathy. No retroperitoneal or mesenteric lymphadenopathy. No pelvic sidewall lymphadenopathy. Reproductive: Uterus is markedly enlarged with multiple mass lesions, compatible with fibroids. Uterus measures 16.5 x 7.8 x 13.4 cm. Endometrial stripe is substantially distorted by the apparent fibroid disease. There is no adnexal mass. Other:  Trace free fluid noted in the cul-de-sac and right lower quadrant. Musculoskeletal: No worrisome lytic or sclerotic osseous abnormality. Rounded lucency in the right femoral head has imaging features suggestive of a benign chondroid lesion. IMPRESSION: 1. Markedly enlarged fibroid uterus. 2. Trace free fluid in the cul-de-sac and right lower quadrant. Finding is indeterminate but can be physiologic in a premenopausal female. 3. 4 mm low-density focus in the tail of pancreas, likely a cyst. Follow-up nonemergent pancreatic MR recommended to further evaluate. Non-emergent MRI should be deferred until patient has been discharged for the acute illness, and can optimally cooperate with positioning and breath-holding instructions. 4. 4 mm low-density focus in the pancreatic tail. Follow-up CT in 12 months could be used  to ensure stability. This recommendation follows ACR consensus guidelines: Management of Incidental Pancreatic Cysts: A White Paper of the ACR Incidental Findings Committee. [place] [id number]. Electronically Signed   By: Misty Stanley M.D.   On: 03/16/2019 20:55   [place] Art/Ven Flow Abd Pelv Doppler  Result Date: 03/16/2019 CLINICAL DATA:  Right adnexal pain and tenderness EXAM: TRANSABDOMINAL AND TRANSVAGINAL ULTRASOUND OF PELVIS TECHNIQUE: Both transabdominal and transvaginal ultrasound examinations of the pelvis were performed. Transabdominal technique was performed for global imaging of the pelvis including uterus, ovaries, adnexal regions, and pelvic cul-de-sac. It was necessary to proceed with endovaginal exam following the transabdominal exam to visualize the . COMPARISON:  None FINDINGS: Uterus Measurements: 16.9 x 8.0 x 11.2 = volume: 72 mL. There are multiple heterogeneously hypoechoic intrauterine fibroids seen throughout. The largest in the posterior right fundus measuring 7.1 x 6.2 x 7.3 cm. Endometrium Thickness: The endometrium is not well visualized. Right ovary Measurements:  4.8 x 3.0 x 3.6 cm = volume: 26 mL. Normal appearance/no adnexal mass. Left ovary Measurements: 4.2 x 1.9 x 2.5 cm = volume: 10 mL. Normal appearance/no adnexal mass. Other findings No abnormal free fluid. IMPRESSION: Multiple intrauterine fibroids. The largest measures 7.1 x 6.2 x 7.3 cm in the posterior right fundus. Normal appearing ovaries. Electronically Signed   By: Prudencio Pair M.D.   On: 03/16/2019 22:13   DG Chest Port 1 View  Result Date: 03/16/2019 CLINICAL DATA:  Chest pain EXAM: PORTABLE CHEST 1 VIEW COMPARISON:  December 14, 2004 FINDINGS: The heart size and mediastinal contours are within normal limits. Both lungs are clear. The visualized skeletal structures are unremarkable. IMPRESSION: No active disease. Electronically Signed   By: Constance Holster M.D.   On: 03/16/2019 19:35    Procedures Procedures (including critical care time)  Medications Ordered in ED Medications  sodium chloride 0.9 % bolus 1,000 mL (0 mLs Intravenous Stopped 03/16/19 2315)  ondansetron (ZOFRAN) injection 4 mg (4 mg Intravenous Given 03/16/19 1908)  morphine 4 MG/ML injection 4 mg (4 mg Intravenous Given 03/16/19 1909)  iohexol (OMNIPAQUE) 300 MG/ML solution 100 mL (100 mLs Intravenous Contrast Given 03/16/19 2021)  iohexol (OMNIPAQUE) 300 MG/ML solution 100 mL (100 mLs Intravenous Contrast Given 03/16/19 2039)    ED Course  I have reviewed the triage vital signs and the nursing notes.  Pertinent labs & imaging results that were available during my care of the patient were reviewed by me and considered in my medical decision making (see chart for details).  Clinical Course as of Mar 17 3  Sat Mar 16, 2019  1827 Initial assessment complete. Significant for rebound tenderness in the RLQ. Nephrolithiasis vs pyelonephritis vs appendacitis vs ovarian cyst/torsion vs PID vs post covid abd pain.  Labs ordered. Zofran for nausea and morphine for 7/10 abd pain.   [RC]  2025 Lab results discussed with patient.  CBC and UA reassuring against nephrolithiasis and pyelonephritis. Awaiting abd CT for further evaluation for appendacitis.   [RC]  2028 Normal EKG and troponin reassuring against ACS.    [RC]  2030 EKG and troponins reassuring to r/o ACS.   [RC]  2132 CT negative for appendicitis or nephrolithiasis. Tail of pancreas has what appears to be a cyst however given the nature of her pain, I am doubtful that this is related. Uterus is enlarged and has numerous fibroids. Obtaining transvaginal US for further evaluation of ovaries and blood flow.  CT ABDOMEN PELVIS W CONTRAST [RC]  2231 Pelvic  US consistent with fibroids as seen on CT. Discussed with patient--she has been told she has fibroids in the past. Fibroids most likely etiology of the pain as most other etiologies have been ruled out. She will need close follow up with OBGYN for discussion of management options. No torsion noted.   [RC]  2319 Discussed plan for discharge with patient including follow up with OBGYN. Will discharge home with tramadol.   [RC]    Clinical Course User Index [RC] Mitzi Hansen, MD   MDM Rules/Calculators/A&P                      Please see ED course for MDM. Final Clinical Impression(s) / ED Diagnoses Final diagnoses:  Uterine leiomyoma, unspecified location    Rx / DC Orders ED Discharge Orders         Ordered    traMADol (ULTRAM) 50 MG tablet  Every 6 hours PRN     03/16/19 2258           Mitzi Hansen, MD 03/17/19 0008    Drenda Freeze, MD 03/20/19 1538

## 2019-03-16 NOTE — ED Provider Notes (Signed)
[facility] EMERGENCY DEPARTMENT Provider Note   CSN: [id number] Arrival date & time: 03/16/19  1716     History Chief Complaint  Patient presents with  . Abdominal Pain  . Arm Pain    Tracey Taylor is a 49 y.o. female.  49 year old G3, P3 female who presents to the emergency room this afternoon for 1 week history of right lower quadrant pain that radiates into her groin and her back.  She notes as being colicky in nature.  She denies any pain with urination.  No fevers, chills.  Does endorse some nausea but no vomiting.  No similar symptoms in the past.  She is currently menstruating.  She is status post tubal ligation.  No other abdominal surgeries. She also endorses some left proximal arm pain that began today.  She notes that it radiates into her shoulder however denies associated chest pain or radiation into the jaw.  She notes that as being constant in nature and is not exacerbated with exertion.  No known inciting event.  No previous injuries or surgeries on the left shoulder.        Past Medical History:  Diagnosis Date  . Headache     Patient Active Problem List   Diagnosis Date Noted  . Upper respiratory tract infection 03/07/2017  . Tension headache 03/21/2016  . Migraine variant, intractable 03/21/2016  . Vertigo 03/21/2016  . Dizziness 03/21/2016    Past Surgical History:  Procedure Laterality Date  . ECTOPIC PREGNANCY SURGERY N/A 1998   tube removed but does not remember which side  . TUBAL LIGATION  2000     OB History   No obstetric history on file.     Family History  Problem Relation Age of Onset  . Diabetes Mother   . Hypertension Mother   . Kidney disease Mother   . Stroke Mother   . Cancer Father   . Diabetes Maternal Aunt     Social History   Tobacco Use  . Smoking status: Never Smoker  . Smokeless tobacco: Never Used  Substance Use Topics  . Alcohol use: Yes    Alcohol/week: 1.0 standard drinks   Types: 1 Glasses of wine per week    Comment: socially  . Drug use: No    Home Medications Prior to Admission medications   Medication Sig Start Date End Date Taking? Authorizing Provider  traMADol (ULTRAM) 50 MG tablet Take 1 tablet (50 mg total) by mouth every 6 (six) hours as needed for up to 5 days. 03/16/19 03/21/19  Mitzi Hansen, MD    Allergies    Gadolinium derivatives  Review of Systems   Review of Systems  Constitutional: Negative for appetite change, chills, fatigue and fever.  HENT: Negative.   Respiratory: Negative for cough, chest tightness and shortness of breath.   Cardiovascular: Negative for chest pain and palpitations.  Gastrointestinal: Positive for abdominal pain and nausea. Negative for blood in stool, constipation, diarrhea and vomiting.  Genitourinary: Positive for flank pain. Negative for dysuria, frequency, hematuria and urgency.  Skin: Negative for rash.  Neurological: Positive for light-headedness.  Psychiatric/Behavioral: Negative for confusion.    Physical Exam Updated Vital Signs BP 127/85 (BP Location: Right Arm)   Pulse 74   Temp 98 F (36.7 C) (Oral)   Resp 16   LMP 02/26/2019 (Approximate) Comment: Negative HCG in Ed today  SpO2 98%   Physical Exam HENT:     Head:     Comments: No apparent  oral lesions. Appears right lower wisdom tooth was recently pulled but does not appear infected. Mild discrepency in warmth of the jaw R>L    Mouth/Throat:     Mouth: Mucous membranes are moist.  Cardiovascular:     Rate and Rhythm: Normal rate and regular rhythm.  Pulmonary:     Effort: Pulmonary effort is normal.     Breath sounds: Normal breath sounds.  Abdominal:     General: Bowel sounds are normal. There is no distension.     Palpations: Abdomen is soft.     Tenderness: There is abdominal tenderness in the right upper quadrant. There is right CVA tenderness and rebound.  Genitourinary:    Vagina: Bleeding present.     Cervix: Normal.      Uterus: Not tender.      Adnexa:        Right: No tenderness.    Skin:    General: Skin is warm and dry.  Neurological:     General: No focal deficit present.     Mental Status: She is alert.  Psychiatric:        Mood and Affect: Mood normal.     ED Results / Procedures / Treatments   Labs (all labs ordered are listed, but only abnormal results are displayed) Labs Reviewed  WET PREP, GENITAL - Abnormal; Notable for the following components:      Result Value   WBC, Wet Prep HPF POC FEW (*)    All other components within normal limits  CBC WITH DIFFERENTIAL/PLATELET - Abnormal; Notable for the following components:   Hemoglobin 10.6 (*)    HCT 32.9 (*)    MCV 70.8 (*)    MCH 22.8 (*)    RDW 19.4 (*)    All other components within normal limits  COMPREHENSIVE METABOLIC PANEL - Abnormal; Notable for the following components:   Calcium 8.7 (*)    Total Protein 6.0 (*)    Albumin 3.2 (*)    All other components within normal limits  URINALYSIS, ROUTINE W REFLEX MICROSCOPIC - Abnormal; Notable for the following components:   Color, Urine STRAW (*)    Hgb urine dipstick SMALL (*)    All other components within normal limits  LIPASE, BLOOD  I-STAT BETA HCG BLOOD, ED ([facility], [facility], [facility] ONLY)  GC/CHLAMYDIA PROBE AMP ([place]) NOT AT [facility]  TROPONIN I (HIGH SENSITIVITY)  TROPONIN I (HIGH SENSITIVITY)    EKG EKG Interpretation  Date/Time:  Saturday March 16 2019 18:25:50 EST Ventricular Rate:  73 PR Interval:    QRS Duration: 88 QT Interval:  361 QTC Calculation: 398 R Axis:   74 Text Interpretation: Sinus rhythm No significant change since last tracing Confirmed by Wandra Arthurs [phone]) on 03/16/2019 6:32:51 PM   Radiology US Transvaginal Non-OB  Result Date: 03/16/2019 CLINICAL DATA:  Right adnexal pain and tenderness EXAM: TRANSABDOMINAL AND TRANSVAGINAL ULTRASOUND OF PELVIS TECHNIQUE: Both transabdominal and transvaginal ultrasound examinations of the pelvis were  performed. Transabdominal technique was performed for global imaging of the pelvis including uterus, ovaries, adnexal regions, and pelvic cul-de-sac. It was necessary to proceed with endovaginal exam following the transabdominal exam to visualize the . COMPARISON:  None FINDINGS: Uterus Measurements: 16.9 x 8.0 x 11.2 = volume: 72 mL. There are multiple heterogeneously hypoechoic intrauterine fibroids seen throughout. The largest in the posterior right fundus measuring 7.1 x 6.2 x 7.3 cm. Endometrium Thickness: The endometrium is not well visualized. Right ovary Measurements: 4.8 x 3.0 x  3.6 cm = volume: 26 mL. Normal appearance/no adnexal mass. Left ovary Measurements: 4.2 x 1.9 x 2.5 cm = volume: 10 mL. Normal appearance/no adnexal mass. Other findings No abnormal free fluid. IMPRESSION: Multiple intrauterine fibroids. The largest measures 7.1 x 6.2 x 7.3 cm in the posterior right fundus. Normal appearing ovaries. Electronically Signed   By: Prudencio Pair M.D.   On: 03/16/2019 22:13   US Pelvis Complete  Result Date: 03/16/2019 CLINICAL DATA:  Right adnexal pain and tenderness EXAM: TRANSABDOMINAL AND TRANSVAGINAL ULTRASOUND OF PELVIS TECHNIQUE: Both transabdominal and transvaginal ultrasound examinations of the pelvis were performed. Transabdominal technique was performed for global imaging of the pelvis including uterus, ovaries, adnexal regions, and pelvic cul-de-sac. It was necessary to proceed with endovaginal exam following the transabdominal exam to visualize the . COMPARISON:  None FINDINGS: Uterus Measurements: 16.9 x 8.0 x 11.2 = volume: 72 mL. There are multiple heterogeneously hypoechoic intrauterine fibroids seen throughout. The largest in the posterior right fundus measuring 7.1 x 6.2 x 7.3 cm. Endometrium Thickness: The endometrium is not well visualized. Right ovary Measurements: 4.8 x 3.0 x 3.6 cm = volume: 26 mL. Normal appearance/no adnexal mass. Left ovary Measurements: 4.2 x 1.9 x 2.5 cm =  volume: 10 mL. Normal appearance/no adnexal mass. Other findings No abnormal free fluid. IMPRESSION: Multiple intrauterine fibroids. The largest measures 7.1 x 6.2 x 7.3 cm in the posterior right fundus. Normal appearing ovaries. Electronically Signed   By: Prudencio Pair M.D.   On: 03/16/2019 22:13   CT ABDOMEN PELVIS W CONTRAST  Result Date: 03/16/2019 CLINICAL DATA:  Abdominal distention. Right-sided flank pain. EXAM: CT ABDOMEN AND PELVIS WITH CONTRAST TECHNIQUE: Multidetector CT imaging of the abdomen and pelvis was performed using the standard protocol following bolus administration of intravenous contrast. CONTRAST:  170mL OMNIPAQUE IOHEXOL 300 MG/ML  SOLN COMPARISON:  None. FINDINGS: Lower chest: Unremarkable. Hepatobiliary: No suspicious focal abnormality within the liver parenchyma. There is no evidence for gallstones, gallbladder wall thickening, or pericholecystic fluid. No intrahepatic or extrahepatic biliary dilation. Pancreas: No dilatation of the main duct. No intraparenchymal cyst. No peripancreatic edema. 4 mm low-density focus identified in the pancreatic tail (25/3) Spleen: No splenomegaly. No focal mass lesion. Adrenals/Urinary Tract: No adrenal nodule or mass. Kidneys unremarkable. No evidence for hydroureter. The urinary bladder appears normal for the degree of distention. Stomach/Bowel: Stomach is nondistended. Duodenum is normally positioned as is the ligament of Treitz. No small bowel wall thickening. No small bowel dilatation. The terminal ileum is normal. Appendix is well seen on sagittal 50/7 and has normal appearance. No gross colonic mass. No colonic wall thickening. Vascular/Lymphatic: No abdominal aortic aneurysm. There is no gastrohepatic or hepatoduodenal ligament lymphadenopathy. No retroperitoneal or mesenteric lymphadenopathy. No pelvic sidewall lymphadenopathy. Reproductive: Uterus is markedly enlarged with multiple mass lesions, compatible with fibroids. Uterus measures 16.5  x 7.8 x 13.4 cm. Endometrial stripe is substantially distorted by the apparent fibroid disease. There is no adnexal mass. Other: Trace free fluid noted in the cul-de-sac and right lower quadrant. Musculoskeletal: No worrisome lytic or sclerotic osseous abnormality. Rounded lucency in the right femoral head has imaging features suggestive of a benign chondroid lesion. IMPRESSION: 1. Markedly enlarged fibroid uterus. 2. Trace free fluid in the cul-de-sac and right lower quadrant. Finding is indeterminate but can be physiologic in a premenopausal female. 3. 4 mm low-density focus in the tail of pancreas, likely a cyst. Follow-up nonemergent pancreatic MR recommended to further evaluate. Non-emergent MRI should be deferred until patient  has been discharged for the acute illness, and can optimally cooperate with positioning and breath-holding instructions. 4. 4 mm low-density focus in the pancreatic tail. Follow-up CT in 12 months could be used to ensure stability. This recommendation follows ACR consensus guidelines: Management of Incidental Pancreatic Cysts: A White Paper of the ACR Incidental Findings Committee. [place] [id number]. Electronically Signed   By: Misty Stanley M.D.   On: 03/16/2019 20:55   [place] Art/Ven Flow Abd Pelv Doppler  Result Date: 03/16/2019 CLINICAL DATA:  Right adnexal pain and tenderness EXAM: TRANSABDOMINAL AND TRANSVAGINAL ULTRASOUND OF PELVIS TECHNIQUE: Both transabdominal and transvaginal ultrasound examinations of the pelvis were performed. Transabdominal technique was performed for global imaging of the pelvis including uterus, ovaries, adnexal regions, and pelvic cul-de-sac. It was necessary to proceed with endovaginal exam following the transabdominal exam to visualize the . COMPARISON:  None FINDINGS: Uterus Measurements: 16.9 x 8.0 x 11.2 = volume: 72 mL. There are multiple heterogeneously hypoechoic intrauterine fibroids seen throughout. The largest in the posterior  right fundus measuring 7.1 x 6.2 x 7.3 cm. Endometrium Thickness: The endometrium is not well visualized. Right ovary Measurements: 4.8 x 3.0 x 3.6 cm = volume: 26 mL. Normal appearance/no adnexal mass. Left ovary Measurements: 4.2 x 1.9 x 2.5 cm = volume: 10 mL. Normal appearance/no adnexal mass. Other findings No abnormal free fluid. IMPRESSION: Multiple intrauterine fibroids. The largest measures 7.1 x 6.2 x 7.3 cm in the posterior right fundus. Normal appearing ovaries. Electronically Signed   By: Prudencio Pair M.D.   On: 03/16/2019 22:13   DG Chest Port 1 View  Result Date: 03/16/2019 CLINICAL DATA:  Chest pain EXAM: PORTABLE CHEST 1 VIEW COMPARISON:  December 14, 2004 FINDINGS: The heart size and mediastinal contours are within normal limits. Both lungs are clear. The visualized skeletal structures are unremarkable. IMPRESSION: No active disease. Electronically Signed   By: Constance Holster M.D.   On: 03/16/2019 19:35    Procedures Procedures (including critical care time)  Medications Ordered in ED Medications  sodium chloride 0.9 % bolus 1,000 mL (0 mLs Intravenous Stopped 03/16/19 2315)  ondansetron (ZOFRAN) injection 4 mg (4 mg Intravenous Given 03/16/19 1908)  morphine 4 MG/ML injection 4 mg (4 mg Intravenous Given 03/16/19 1909)  iohexol (OMNIPAQUE) 300 MG/ML solution 100 mL (100 mLs Intravenous Contrast Given 03/16/19 2021)  iohexol (OMNIPAQUE) 300 MG/ML solution 100 mL (100 mLs Intravenous Contrast Given 03/16/19 2039)    ED Course  I have reviewed the triage vital signs and the nursing notes.  Pertinent labs & imaging results that were available during my care of the patient were reviewed by me and considered in my medical decision making (see chart for details).  Clinical Course as of Mar 16 2327  Sat Mar 16, 2019  1827 Initial assessment complete. Significant for rebound tenderness in the RLQ. Nephrolithiasis vs pyelonephritis vs appendacitis vs ovarian cyst/torsion vs PID vs post  covid abd pain.  Labs ordered. Zofran for nausea and morphine for 7/10 abd pain.   [RC]  2025 Lab results discussed with patient. CBC and UA reassuring against nephrolithiasis and pyelonephritis. Awaiting abd CT for further evaluation for appendacitis.   [RC]  2028 Normal EKG and troponin reassuring against ACS.    [RC]  2030 EKG and troponins reassuring to r/o ACS.   [RC]  2132 CT negative for appendicitis or nephrolithiasis. Tail of pancreas has what appears to be a cyst however given the nature of her pain, I am  doubtful that this is related. Uterus is enlarged and has numerous fibroids. Obtaining transvaginal US for further evaluation of ovaries and blood flow.  CT ABDOMEN PELVIS W CONTRAST [RC]  2231 Pelvic US consistent with fibroids as seen on CT. Discussed with patient--she has been told she has fibroids in the past. Fibroids most likely etiology of the pain as most other etiologies have been ruled out. She will need close follow up with OBGYN for discussion of management options. No torsion noted.   [RC]  2319 Discussed plan for discharge with patient including follow up with OBGYN. Will discharge home with tramadol.   [RC]    Clinical Course User Index [RC] Mitzi Hansen, MD   MDM Rules/Calculators/A&P                     Please see ED course for MDM. Final Clinical Impression(s) / ED Diagnoses Final diagnoses:  Uterine leiomyoma, unspecified location    Rx / DC Orders ED Discharge Orders         Ordered    traMADol (ULTRAM) 50 MG tablet  Every 6 hours PRN     03/16/19 2258           Mitzi Hansen, MD 03/16/19 2341    Drenda Freeze, MD 03/17/19 [phone]

## 2019-03-16 NOTE — Discharge Instructions (Signed)
It was a pleasure meeting you today.  The most likely cause of your pain are the fibroids in your uterus. I have placed a referral to OBGYN so you can discuss management options with them.  Please call their clinic to arrange an appointment. I will also send in a prescription for Tramadol to help with your pain.

## 2019-03-16 NOTE — ED Notes (Signed)
Sent secure message to pharmacy to verify medications.

## 2019-03-19 LAB — GC/CHLAMYDIA PROBE AMP (~~LOC~~) NOT AT ARMC
Chlamydia: NEGATIVE
Neisseria Gonorrhea: NEGATIVE

## 2019-04-04 ENCOUNTER — Other Ambulatory Visit: Payer: Self-pay

## 2019-04-04 ENCOUNTER — Ambulatory Visit (INDEPENDENT_AMBULATORY_CARE_PROVIDER_SITE_OTHER): Payer: 59 | Admitting: Physician Assistant

## 2019-04-04 ENCOUNTER — Encounter: Payer: Self-pay | Admitting: Physician Assistant

## 2019-04-04 VITALS — BP 120/80 | HR 81 | Temp 97.7°F | Ht 66.0 in | Wt 195.5 lb

## 2019-04-04 DIAGNOSIS — Z23 Encounter for immunization: Secondary | ICD-10-CM | POA: Diagnosis not present

## 2019-04-04 DIAGNOSIS — Z136 Encounter for screening for cardiovascular disorders: Secondary | ICD-10-CM | POA: Diagnosis not present

## 2019-04-04 DIAGNOSIS — Z13220 Encounter for screening for lipoid disorders: Secondary | ICD-10-CM | POA: Diagnosis not present

## 2019-04-04 DIAGNOSIS — Z114 Encounter for screening for human immunodeficiency virus [HIV]: Secondary | ICD-10-CM

## 2019-04-04 DIAGNOSIS — N3946 Mixed incontinence: Secondary | ICD-10-CM

## 2019-04-04 DIAGNOSIS — E669 Obesity, unspecified: Secondary | ICD-10-CM

## 2019-04-04 DIAGNOSIS — Z0001 Encounter for general adult medical examination with abnormal findings: Secondary | ICD-10-CM

## 2019-04-04 DIAGNOSIS — D649 Anemia, unspecified: Secondary | ICD-10-CM | POA: Diagnosis not present

## 2019-04-04 DIAGNOSIS — Z0000 Encounter for general adult medical examination without abnormal findings: Secondary | ICD-10-CM | POA: Diagnosis not present

## 2019-04-04 DIAGNOSIS — K862 Cyst of pancreas: Secondary | ICD-10-CM

## 2019-04-04 DIAGNOSIS — D251 Intramural leiomyoma of uterus: Secondary | ICD-10-CM

## 2019-04-04 LAB — URINALYSIS, ROUTINE W REFLEX MICROSCOPIC
Bilirubin Urine: NEGATIVE
Hgb urine dipstick: NEGATIVE
Ketones, ur: NEGATIVE
Leukocytes,Ua: NEGATIVE
Nitrite: NEGATIVE
RBC / HPF: NONE SEEN (ref 0–?)
Specific Gravity, Urine: 1.025 (ref 1.000–1.030)
Total Protein, Urine: NEGATIVE
Urine Glucose: NEGATIVE
Urobilinogen, UA: 0.2 (ref 0.0–1.0)
pH: 5.5 (ref 5.0–8.0)

## 2019-04-04 LAB — LIPID PANEL
Cholesterol: 213 mg/dL — ABNORMAL HIGH (ref 0–200)
HDL: 53.4 mg/dL (ref >39.00)
LDL Cholesterol: 137 mg/dL — ABNORMAL HIGH (ref 0–99)
NonHDL: 159.22
Total CHOL/HDL Ratio: 4
Triglycerides: 110 mg/dL (ref 0.0–149.0)
VLDL: 22 mg/dL (ref 0.0–40.0)

## 2019-04-04 LAB — CBC WITH DIFFERENTIAL/PLATELET
Basophils Absolute: 0 10*3/uL (ref 0.0–0.1)
Basophils Relative: 0.3 % (ref 0.0–3.0)
Eosinophils Absolute: 0.1 10*3/uL (ref 0.0–0.7)
Eosinophils Relative: 1.2 % (ref 0.0–5.0)
HCT: 33.9 % — ABNORMAL LOW (ref 36.0–46.0)
Hemoglobin: 11.2 g/dL — ABNORMAL LOW (ref 12.0–15.0)
Lymphocytes Relative: 49.7 % — ABNORMAL HIGH (ref 12.0–46.0)
Lymphs Abs: 3 10*3/uL (ref 0.7–4.0)
MCHC: 32.9 g/dL (ref 30.0–36.0)
MCV: 70.6 fl — ABNORMAL LOW (ref 78.0–100.0)
Monocytes Absolute: 0.5 10*3/uL (ref 0.1–1.0)
Monocytes Relative: 8 % (ref 3.0–12.0)
Neutro Abs: 2.4 10*3/uL (ref 1.4–7.7)
Neutrophils Relative %: 40.8 % — ABNORMAL LOW (ref 43.0–77.0)
Platelets: 210 10*3/uL (ref 150.0–400.0)
RBC: 4.81 Mil/uL (ref 3.87–5.11)
RDW: 19.7 % — ABNORMAL HIGH (ref 11.5–15.5)
WBC: 5.9 10*3/uL (ref 4.0–10.5)

## 2019-04-04 NOTE — Progress Notes (Signed)
I acted as a Education administrator for [employer], PA-C Anselmo Pickler, LPN   Subjective:    Tracey Taylor is a 49 y.o. female and is here for a comprehensive physical exam.   HPI  Health Maintenance Due  Topic Date Due  . HIV Screening  07/[birth year]1987    Acute Concerns: Pancreatic cyst -- incidental finding on abd CT from recent ER visit. Per radiology report, recommend non-urgent MRI to further evaluate the area.  She denies upper abdominal pain, unintentional weight loss, family history of pancreatic cancer. Uterine fibroids -- found on pelvic CT and pelvic u/s from recent ER visit. Has an appointment on March 25th with her OB/GYN. Urgency of urination -- getting worse with time; occurring daily;  having some incontinence with jumping and; hx of 3 vaginal births.  Denies dysuria, changes in stool.  Chronic Issues: Anemia --secondary to heavy bleeding from fibroids.  Unable to tolerate oral supplements.  Denies palpitations, lightheadedness.  Health Maintenance: Immunizations -- UTD, will give Tdap today Colonoscopy -- N/A Mammogram -- scheduled next month at [facility]YN PAP -- scheduled with GYN Bone Density -- N/A Diet --recently started cleaning up her diet again Sleep habits --intermittent sleep issues because of Covid, but overall okay Exercise --trying to get back into a regular routine Weight -- Weight: 195 lb 8 oz (88.7 kg)  Mood --no concerns Weight history: Wt Readings from Last 10 Encounters:  04/04/19 195 lb 8 oz (88.7 kg)  03/15/19 190 lb (86.2 kg)  03/07/17 197 lb (89.4 kg)  03/21/16 197 lb 3.2 oz (89.4 kg)  03/16/16 194 lb 4 oz (88.1 kg)  10/10/14 198 lb (89.8 kg)   Patient's last menstrual period was 03/11/2019 (approximate). Period characteristics: Heavy Alcohol use: No significant intake Tobacco use: None  Depression screen PHQ 2/9 04/04/2019  Decreased Interest 0  Down, Depressed, Hopeless 0  PHQ - 2 Score 0     Other providers/specialists: Patient  Care Team: Inda Coke, [place] as PCP - General (Physician Assistant)    PMHx, SurgHx, SocialHx, Medications, and Allergies were reviewed in the Visit Navigator and updated as appropriate.   Past Medical History:  Diagnosis Date  . Headache      Past Surgical History:  Procedure Laterality Date  . ECTOPIC PREGNANCY SURGERY N/A 1998   tube removed but does not remember which side  . TUBAL LIGATION  2000     Family History  Problem Relation Age of Onset  . Diabetes Mother   . Hypertension Mother   . Kidney disease Mother   . Stroke Mother   . Cancer Father   . Diabetes Maternal Aunt     Social History   Tobacco Use  . Smoking status: Never Smoker  . Smokeless tobacco: Never Used  Substance Use Topics  . Alcohol use: Yes    Alcohol/week: 1.0 standard drinks    Types: 1 Glasses of wine per week    Comment: socially  . Drug use: No    Review of Systems:   Review of Systems  Constitutional: Negative for chills, fever, malaise/fatigue and weight loss.  HENT: Negative for hearing loss, sinus pain and sore throat.   Respiratory: Negative for cough and hemoptysis.   Cardiovascular: Negative for chest pain, palpitations, leg swelling and PND.  Gastrointestinal: Negative for abdominal pain, constipation, diarrhea, heartburn, nausea and vomiting.  Genitourinary: Positive for urgency. Negative for dysuria and frequency.  Musculoskeletal: Negative for back pain, myalgias and neck pain.  Skin: Negative for itching  and rash.  Neurological: Negative for dizziness, tingling, seizures and headaches.  Endo/Heme/Allergies: Negative for polydipsia.  Psychiatric/Behavioral: Negative for depression. The patient is not nervous/anxious.     Objective:   BP 120/80 (BP Location: Left Arm, Patient Position: Sitting, Cuff Size: Large)   Pulse 81   Temp 97.7 F (36.5 C) (Temporal)   Ht 5\' 6"  (1.676 m)   Wt 195 lb 8 oz (88.7 kg)   LMP 03/11/2019 (Approximate)   SpO2 97%   BMI  31.55 kg/m  Body mass index is 31.55 kg/m.   General Appearance:    Alert, cooperative, no distress, appears stated age  Head:    Normocephalic, without obvious abnormality, atraumatic  Eyes:    PERRL, conjunctiva/corneas clear, EOM's intact, fundi    benign, both eyes  Ears:    Normal TM's and external ear canals, both ears  Nose:   Nares normal, septum midline, mucosa normal, no drainage    or sinus tenderness  Throat:   Lips, mucosa, and tongue normal; teeth and gums normal  Neck:   Supple, symmetrical, trachea midline, no adenopathy;    thyroid:  no enlargement/tenderness/nodules; no carotid   bruit or JVD  Back:     Symmetric, no curvature, ROM normal, no CVA tenderness  Lungs:     Clear to auscultation bilaterally, respirations unlabored  Chest Wall:    No tenderness or deformity   Heart:    Regular rate and rhythm, S1 and S2 normal, no murmur, rub or gallop  Breast Exam:   Deferred  Abdomen:     Soft, non-tender, bowel sounds active all four quadrants,    no masses, palpable enlarged uterus on lower R side of abdomen  Genitalia:   Deferred  Extremities:   Extremities normal, atraumatic, no cyanosis or edema  Pulses:   2+ and symmetric all extremities  Skin:   Skin color, texture, turgor normal, no rashes or lesions  Lymph nodes:   Cervical, supraclavicular, and axillary nodes normal  Neurologic:   CNII-XII intact, normal strength, sensation and reflexes    throughout     Assessment/Plan:   Cariana was seen today for annual exam.  Diagnoses and all orders for this visit:  Encounter for general adult medical examination with abnormal findings Today patient counseled on age appropriate routine health concerns for screening and prevention, each reviewed and up to date or declined. Immunizations reviewed and up to date or declined. Labs ordered and reviewed. Risk factors for depression reviewed and negative. Hearing function and visual acuity are intact. ADLs screened and  addressed as needed. Functional ability and level of safety reviewed and appropriate. Education, counseling and referrals performed based on assessed risks today. Patient provided with a copy of personalized plan for preventive services.  Intramural leiomyoma of uterus She has scheduled follow-up with her OB/GYN to discuss this.  Pancreatic cyst Incidental finding on her CT scan of her abdomen from ER visit, she is agreeable to follow-up and get an MRI as recommended per radiology. I have ordered this for her.  Anemia, unspecified type We will assess level of deficiency today with labs, and determine appropriate supplementation if needed.  Currently asymptomatic. -     CBC with Differential/Platelet -     Iron, TIBC and Ferritin Panel  Mixed stress and urge urinary incontinence We will check urine today however I did recommend that we consider pelvic floor physical therapy for further evaluation and management.  Patient would like to think about this. -  Urinalysis, Routine w reflex microscopic  Encounter for lipid screening for cardiovascular disease -     Lipid panel  Screening for HIV (human immunodeficiency virus) -     HIV Antibody (routine testing w rflx)  Obesity Encouraged patient to get back in diet and exercise regimen.  Need for prophylactic vaccination with combined diphtheria-tetanus-pertussis (DTP) vaccine -     Tdap vaccine greater than or equal to 7yo IM   Well Adult Exam: Labs ordered: Yes. Patient counseling was done. See below for items discussed. Discussed the patient's BMI.  The BMI is not in the acceptable range; BMI management plan is completed Follow up as needed for acute illness. Breast cancer screening: scheduled next month. Cervical cancer screening: scheduled next month   Patient Counseling: [x]    Nutrition: Stressed importance of moderation in sodium/caffeine intake, saturated fat and cholesterol, caloric balance, sufficient intake of fresh fruits,  vegetables, fiber, calcium, iron, and 1 mg of folate supplement per day (for females capable of pregnancy).  [x]    Stressed the importance of regular exercise.   [x]    Substance Abuse: Discussed cessation/primary prevention of tobacco, alcohol, or other drug use; driving or other dangerous activities under the influence; availability of treatment for abuse.   [x]    Injury prevention: Discussed safety belts, safety helmets, smoke detector, smoking near bedding or upholstery.   [x]    Sexuality: Discussed sexually transmitted diseases, partner selection, use of condoms, avoidance of unintended pregnancy  and contraceptive alternatives.  [x]    Dental health: Discussed importance of regular tooth brushing, flossing, and dental visits.  [x]    Health maintenance and immunizations reviewed. Please refer to Health maintenance section.   CMA or LPN served as scribe during this visit. History, Physical, and Plan performed by medical provider. The above documentation has been reviewed and is accurate and complete.   Inda Coke, PA-C [place]

## 2019-04-04 NOTE — Patient Instructions (Addendum)
It was great to see you!  Please go to the lab for blood work.   You will be contacted about your MRI.  Let me know your thoughts about the pelvic floor PT.  Our office will call you with your results unless you have chosen to receive results via MyChart.  If your blood work is normal we will follow-up each year for physicals and as scheduled for chronic medical problems.  If anything is abnormal we will treat accordingly and get you in for a follow-up.  Take care,  [facility] Maintenance, Female Adopting a healthy lifestyle and getting preventive care are important in promoting health and wellness. Ask your health care provider about:  The right schedule for you to have regular tests and exams.  Things you can do on your own to prevent diseases and keep yourself healthy. What should I know about diet, weight, and exercise? Eat a healthy diet   Eat a diet that includes plenty of vegetables, fruits, low-fat dairy products, and lean protein.  Do not eat a lot of foods that are high in solid fats, added sugars, or sodium. Maintain a healthy weight Body mass index (BMI) is used to identify weight problems. It estimates body fat based on height and weight. Your health care provider can help determine your BMI and help you achieve or maintain a healthy weight. Get regular exercise Get regular exercise. This is one of the most important things you can do for your health. Most adults should:  Exercise for at least 150 minutes each week. The exercise should increase your heart rate and make you sweat (moderate-intensity exercise).  Do strengthening exercises at least twice a week. This is in addition to the moderate-intensity exercise.  Spend less time sitting. Even light physical activity can be beneficial. Watch cholesterol and blood lipids Have your blood tested for lipids and cholesterol at 47 years of age, then have this test every 5 years. Have your cholesterol  levels checked more often if:  Your lipid or cholesterol levels are high.  You are older than 47 years of age.  You are at high risk for heart disease. What should I know about cancer screening? Depending on your health history and family history, you may need to have cancer screening at various ages. This may include screening for:  Breast cancer.  Cervical cancer.  Colorectal cancer.  Skin cancer.  Lung cancer. What should I know about heart disease, diabetes, and high blood pressure? Blood pressure and heart disease  High blood pressure causes heart disease and increases the risk of stroke. This is more likely to develop in people who have high blood pressure readings, are of African descent, or are overweight.  Have your blood pressure checked: ? Every 3-5 years if you are 39-42 years of age. ? Every year if you are 78 years old or older. Diabetes Have regular diabetes screenings. This checks your fasting blood sugar level. Have the screening done:  Once every three years after age 50 if you are at a normal weight and have a low risk for diabetes.  More often and at a younger age if you are overweight or have a high risk for diabetes. What should I know about preventing infection? Hepatitis B If you have a higher risk for hepatitis B, you should be screened for this virus. Talk with your health care provider to find out if you are at risk for hepatitis B infection. Hepatitis C Testing is  recommended for:  Everyone born from [birth year] through 1963.  Anyone with known risk factors for hepatitis C. Sexually transmitted infections (STIs)  Get screened for STIs, including gonorrhea and chlamydia, if: ? You are sexually active and are younger than 47 years of age. ? You are older than 47 years of age and your health care provider tells you that you are at risk for this type of infection. ? Your sexual activity has changed since you were last screened, and you are at increased  risk for chlamydia or gonorrhea. Ask your health care provider if you are at risk.  Ask your health care provider about whether you are at high risk for HIV. Your health care provider may recommend a prescription medicine to help prevent HIV infection. If you choose to take medicine to prevent HIV, you should first get tested for HIV. You should then be tested every 3 months for as long as you are taking the medicine. Pregnancy  If you are about to stop having your period (premenopausal) and you may become pregnant, seek counseling before you get pregnant.  Take 400 to 800 micrograms (mcg) of folic acid every day if you become pregnant.  Ask for birth control (contraception) if you want to prevent pregnancy. Osteoporosis and menopause Osteoporosis is a disease in which the bones lose minerals and strength with aging. This can result in bone fractures. If you are 9 years old or older, or if you are at risk for osteoporosis and fractures, ask your health care provider if you should:  Be screened for bone loss.  Take a calcium or vitamin D supplement to lower your risk of fractures.  Be given hormone replacement therapy (HRT) to treat symptoms of menopause. Follow these instructions at home: Lifestyle  Do not use any products that contain nicotine or tobacco, such as cigarettes, e-cigarettes, and chewing tobacco. If you need help quitting, ask your health care provider.  Do not use street drugs.  Do not share needles.  Ask your health care provider for help if you need support or information about quitting drugs. Alcohol use  Do not drink alcohol if: ? Your health care provider tells you not to drink. ? You are pregnant, may be pregnant, or are planning to become pregnant.  If you drink alcohol: ? Limit how much you use to 0-1 drink a day. ? Limit intake if you are breastfeeding.  Be aware of how much alcohol is in your drink. In the [place] one drink equals one 12 oz bottle of beer  (355 mL), one 5 oz glass of wine (148 mL), or one 1 oz glass of hard liquor (44 mL). General instructions  Schedule regular health, dental, and eye exams.  Stay current with your vaccines.  Tell your health care provider if: ? You often feel depressed. ? You have ever been abused or do not feel safe at home. Summary  Adopting a healthy lifestyle and getting preventive care are important in promoting health and wellness.  Follow your health care provider's instructions about healthy diet, exercising, and getting tested or screened for diseases.  Follow your health care provider's instructions on monitoring your cholesterol and blood pressure. This information is not intended to replace advice given to you by your health care provider. Make sure you discuss any questions you have with your health care provider. Document Revised: 01/17/2018 Document Reviewed: 01/17/2018 Elsevier Patient Education  2020 [employer].

## 2019-04-05 LAB — IRON,TIBC AND FERRITIN PANEL
%SAT: 14 % (calc) — ABNORMAL LOW (ref 16–45)
Ferritin: 5 ng/mL — ABNORMAL LOW (ref 16–232)
Iron: 57 ug/dL (ref 40–190)
TIBC: 407 mcg/dL (calc) (ref 250–450)

## 2019-04-05 LAB — HIV ANTIBODY (ROUTINE TESTING W REFLEX): HIV 1&2 Ab, 4th Generation: NONREACTIVE

## 2019-05-03 LAB — HM MAMMOGRAPHY

## 2019-05-09 LAB — HM PAP SMEAR: HM Pap smear: NEGATIVE

## 2019-05-15 ENCOUNTER — Encounter: Payer: Self-pay | Admitting: Physician Assistant

## 2019-07-19 ENCOUNTER — Other Ambulatory Visit: Payer: Self-pay | Admitting: Obstetrics and Gynecology

## 2019-07-19 NOTE — H&P (Signed)
49 y.o. G4P3 with symptomatic fibroid uterus.  Previously:"US Uterus Measurements: 16.9 x 8.0 x 11.2 = volume: 72 mL. There are multiple heterogeneously hypoechoic intrauterine fibroids seen throughout. The largest in the posterior right fundus measuring 7.1 x 6.2 x 7.3 cm. Endometrium Thickness: The endometrium is not well visualized. Right ovary Measurements: 4.8 x 3.0 x 3.6 cm = volume: 26 mL. Normal appearance/no adnexal mass. Left ovary Measurements: 4.2 x 1.9 x 2.5 cm = volume: 10 mL. Normal appearance/no adnexal mass. Other findings No abnormal free fluid. Pain lower on R side, sharp and stabbing, went to ER. No blockage of ureters on CT. Periods are still the same and heavy. Cramping with periods every three weeks and cramping one week before as well."   Past Medical History:  Diagnosis Date  . Headache    Past Surgical History:  Procedure Laterality Date  . ECTOPIC PREGNANCY SURGERY N/A 1998   tube removed but does not remember which side  . TUBAL LIGATION  2000    Social History   Socioeconomic History  . Marital status: Divorced    Spouse name: Not on file  . Number of children: Not on file  . Years of education: Not on file  . Highest education level: Not on file  Occupational History  . Not on file  Tobacco Use  . Smoking status: Never Smoker  . Smokeless tobacco: Never Used  Substance and Sexual Activity  . Alcohol use: Yes    Alcohol/week: 1.0 standard drink    Types: 1 Glasses of wine per week    Comment: socially  . Drug use: No  . Sexual activity: Yes    Partners: Male    Birth control/protection: None, Surgical  Other Topics Concern  . Not on file  Social History Narrative  . Not on file   Social Determinants of Health   Financial Resource Strain:   . Difficulty of Paying Living Expenses:   Food Insecurity:   . Worried About Charity fundraiser in the Last Year:   . Arboriculturist in the Last Year:   Transportation Needs:   . Lexicographer (Medical):   Marland Kitchen Lack of Transportation (Non-Medical):   Physical Activity:   . Days of Exercise per Week:   . Minutes of Exercise per Session:   Stress:   . Feeling of Stress :   Social Connections:   . Frequency of Communication with Friends and Family:   . Frequency of Social Gatherings with Friends and Family:   . Attends Religious Services:   . Active Member of Clubs or Organizations:   . Attends Archivist Meetings:   Marland Kitchen Marital Status:   Intimate Partner Violence:   . Fear of Current or Ex-Partner:   . Emotionally Abused:   Marland Kitchen Physically Abused:   . Sexually Abused:     No current facility-administered medications on file prior to encounter.   Current Outpatient Medications on File Prior to Encounter  Medication Sig Dispense Refill  . aspirin-acetaminophen-caffeine (EXCEDRIN MIGRAINE) 250-250-65 MG tablet Take 2 tablets by mouth every 6 (six) hours as needed for headache.    . cetirizine (ZYRTEC) 10 MG tablet Take 10 mg by mouth daily.    . traMADol (ULTRAM) 50 MG tablet Take 50 mg by mouth every 6 (six) hours as needed for moderate pain.      Allergies  Allergen Reactions  . Gadolinium Derivatives Hives and Itching    Itching and one hive  on right deltoid area.      There were no vitals filed for this visit.  Lungs: clear to ascultation Cor:  RRR Abdomen:  soft, nontender, nondistended. Ex:  no cords, erythema Pelvic:   Vulva: no masses, no atrophy, no lesions Vagina: no tenderness, no erythema, no abnormal vaginal discharge, no vesicle(s) or ulcers, no cystocele, no rectocele Cervix: grossly normal, no discharge, no cervical motion tenderness, sample taken for a Pap smear Uterus: normal shape, no uterine prolapse, non-tender, enlarged (12 weeks, bulky), retroverted Bladder/Urethra: normal meatus, no urethral discharge, no urethral mass, bladder non distended, Urethra well supported Adnexa/Parametria: no parametrial tenderness, no  parametrial mass, no adnexal tenderness, no ovarian mass   A:  For Robotic TLH, salpingectomies (although presumably one side has been removed already) and cysto.   P: All risks, benefits and alternatives d/w patient and she desires to proceed.  Patient has undergone a modified diet, ERAS protocol and will receive preop antibiotics and SCDs during the operation.   Pt to have extended recovery but will go home same day if eating, ambulating, voiding and pain control is good.  Daria Pastures

## 2019-07-23 NOTE — Patient Instructions (Addendum)
Get your Covid test at [facility] in [place] on Saturday 6/19 at 10:20 AM. This is a drive thru.   Salley Boxley Doggett       Your procedure is scheduled on   07/31/19   Report to [place]  at 5:30  A.M.   Call this number if you have problems the morning of surgery:[phone]   OUR ADDRESS IS [address], WE ARE LOCATED IN THE [facility] WITH [facility].   Remember:  Do not eat food or drink liquids after midnight.   Take these medicines the morning of surgery with A SIP OF WATER: Zyrtec if needed   Do not wear jewelry, make-up or nail polish.   Do not wear lotions, powders, or perfumes, or deoderant.   Do not shave 48 hours prior to surgery.    Do not bring valuables to the hospital.   [facility] is not responsible for any belongings or valuables.  Contacts, dentures or bridgework may not be worn into surgery.   For patients admitted to the hospital, discharge time will be determined by your treatment team.  Patients discharged the day of surgery will not be allowed to drive home.   Special instructions:   Please read over the following fact sheets that you were given:    [facility] - Preparing for Surgery  Before surgery, you can play an important role .  Because skin is not sterile, your skin needs to be as free of germs as possible.   You can reduce the number of germs on your skin by washing with CHG (chlorahexidine gluconate) soap before surgery.   CHG is an antiseptic cleaner which kills germs and bonds with the skin to continue killing germs even after washing. Please DO NOT use if you have an allergy to CHG or antibacterial soaps.   If your skin becomes reddened/irritated stop using the CHG and inform your nurse when you arrive at Short Stay. Do not shave (including legs and underarms) for at least 48 hours prior to the first CHG shower.    Please follow these instructions carefully:  1.  Shower with  CHG Soap the night before surgery and the  morning of Surgery.  2.  If you choose to wash your hair, wash your hair first as usual with your  normal  shampoo.  3.  After you shampoo, rinse your hair and body thoroughly to remove the  shampoo.                                        4.  Use CHG as you would any other liquid soap.  You can apply chg directly  to the skin and wash                       Gently with a scrungie or clean washcloth.  5.  Apply the CHG Soap to your body ONLY FROM THE NECK DOWN.   Do not use on face/ open                           Wound or open sores. Avoid contact with eyes, ears mouth and genitals (private parts).  Wash face,  Genitals (private parts) with your normal soap.             6.  Wash thoroughly, paying special attention to the area where your surgery  will be performed.  7.  Thoroughly rinse your body with warm water from the neck down.  8.  DO NOT shower/wash with your normal soap after using and rinsing off  the CHG Soap.             9.  Pat yourself dry with a clean towel.            10.  Wear clean pajamas.            11.  Place clean sheets on your bed the night of your first shower and do not  sleep with pets. Day of Surgery : Do not apply any lotions/deodorants the morning of surgery.  Please wear clean clothes to the hospital/surgery center.  FAILURE TO FOLLOW THESE INSTRUCTIONS MAY RESULT IN THE CANCELLATION OF YOUR SURGERY PATIENT SIGNATURE_________________________________  NURSE SIGNATURE__________________________________  ________________________________________________________________________

## 2019-07-24 ENCOUNTER — Encounter (HOSPITAL_COMMUNITY): Payer: Self-pay

## 2019-07-24 ENCOUNTER — Encounter (HOSPITAL_COMMUNITY)
Admission: RE | Admit: 2019-07-24 | Discharge: 2019-07-24 | Disposition: A | Discharge status: Home / Self Care | Payer: 59 | Source: Ambulatory Visit | Attending: Obstetrics and Gynecology | Admitting: Obstetrics and Gynecology

## 2019-07-24 ENCOUNTER — Other Ambulatory Visit: Payer: Self-pay

## 2019-07-24 DIAGNOSIS — Z01812 Encounter for preprocedural laboratory examination: Secondary | ICD-10-CM | POA: Diagnosis not present

## 2019-07-24 LAB — CBC
HCT: 30 % — ABNORMAL LOW (ref 36.0–46.0)
Hemoglobin: 9.7 g/dL — ABNORMAL LOW (ref 12.0–15.0)
MCH: 21.6 pg — ABNORMAL LOW (ref 26.0–34.0)
MCHC: 32.3 g/dL (ref 30.0–36.0)
MCV: 66.7 fL — ABNORMAL LOW (ref 80.0–100.0)
Platelets: 217 10*3/uL (ref 150–400)
RBC: 4.5 MIL/uL (ref 3.87–5.11)
RDW: 18.8 % — ABNORMAL HIGH (ref 11.5–15.5)
WBC: 5.6 10*3/uL (ref 4.0–10.5)
nRBC: 0 % (ref 0.0–0.2)

## 2019-07-24 NOTE — Progress Notes (Signed)
COVID Vaccine Completed:yes Date COVID Vaccine completed:05/21/19 COVID vaccine manufacturer: [place]      PCP - [facility]. [place] Cardiologist -no   Chest x-ray -no  EKG - no Stress Test - no ECHO - no Cardiac Cath no-   Sleep Study - no CPAP -   Fasting Blood Sugar - NA Checks Blood Sugar _____ times a day  Blood Thinner Instructions:NA Aspirin Instructions: Last Dose:  Anesthesia review:   Patient denies shortness of breath, fever, cough and chest pain at PAT appointment  yes Patient verbalized understanding of instructions that were given to them at the PAT appointment. Patient was also instructed that they will need to review over the PAT instructions again at home before surgery. Yes Pt is in good physical condition.

## 2019-07-27 ENCOUNTER — Other Ambulatory Visit (HOSPITAL_COMMUNITY)
Admission: RE | Admit: 2019-07-27 | Discharge: 2019-07-27 | Disposition: A | Discharge status: Home / Self Care | Payer: 59 | Source: Ambulatory Visit | Attending: Obstetrics and Gynecology | Admitting: Obstetrics and Gynecology

## 2019-07-27 DIAGNOSIS — Z20822 Contact with and (suspected) exposure to covid-19: Secondary | ICD-10-CM | POA: Insufficient documentation

## 2019-07-27 DIAGNOSIS — Z01812 Encounter for preprocedural laboratory examination: Secondary | ICD-10-CM | POA: Diagnosis not present

## 2019-07-27 LAB — SARS CORONAVIRUS 2 (TAT 6-24 HRS): SARS Coronavirus 2: NEGATIVE

## 2019-07-31 ENCOUNTER — Ambulatory Visit (HOSPITAL_BASED_OUTPATIENT_CLINIC_OR_DEPARTMENT_OTHER): Payer: 59 | Admitting: Anesthesiology

## 2019-07-31 ENCOUNTER — Encounter (HOSPITAL_BASED_OUTPATIENT_CLINIC_OR_DEPARTMENT_OTHER)
Admission: RE | Disposition: A | Discharge status: Home / Self Care | Payer: Self-pay | Source: Ambulatory Visit | Attending: Obstetrics and Gynecology

## 2019-07-31 ENCOUNTER — Encounter (HOSPITAL_BASED_OUTPATIENT_CLINIC_OR_DEPARTMENT_OTHER): Payer: Self-pay | Admitting: Obstetrics and Gynecology

## 2019-07-31 ENCOUNTER — Ambulatory Visit (HOSPITAL_BASED_OUTPATIENT_CLINIC_OR_DEPARTMENT_OTHER)
Admission: RE | Admit: 2019-07-31 | Discharge: 2019-07-31 | Disposition: A | Discharge status: Home / Self Care | Payer: 59 | Source: Ambulatory Visit | Attending: Obstetrics and Gynecology | Admitting: Obstetrics and Gynecology

## 2019-07-31 ENCOUNTER — Other Ambulatory Visit: Payer: Self-pay

## 2019-07-31 DIAGNOSIS — D259 Leiomyoma of uterus, unspecified: Secondary | ICD-10-CM | POA: Insufficient documentation

## 2019-07-31 DIAGNOSIS — N838 Other noninflammatory disorders of ovary, fallopian tube and broad ligament: Secondary | ICD-10-CM | POA: Diagnosis not present

## 2019-07-31 DIAGNOSIS — Z888 Allergy status to other drugs, medicaments and biological substances status: Secondary | ICD-10-CM | POA: Insufficient documentation

## 2019-07-31 DIAGNOSIS — Z7982 Long term (current) use of aspirin: Secondary | ICD-10-CM | POA: Diagnosis not present

## 2019-07-31 DIAGNOSIS — Z8759 Personal history of other complications of pregnancy, childbirth and the puerperium: Secondary | ICD-10-CM | POA: Diagnosis not present

## 2019-07-31 DIAGNOSIS — N800 Endometriosis of uterus: Secondary | ICD-10-CM | POA: Diagnosis not present

## 2019-07-31 DIAGNOSIS — Z9889 Other specified postprocedural states: Secondary | ICD-10-CM | POA: Diagnosis present

## 2019-07-31 DIAGNOSIS — Z79899 Other long term (current) drug therapy: Secondary | ICD-10-CM | POA: Insufficient documentation

## 2019-07-31 HISTORY — PX: CYSTOSCOPY: SHX5120

## 2019-07-31 HISTORY — PX: ROBOTIC ASSISTED LAPAROSCOPIC HYSTERECTOMY AND SALPINGECTOMY: SHX6379

## 2019-07-31 LAB — TYPE AND SCREEN
ABO/RH(D): A POS
Antibody Screen: NEGATIVE

## 2019-07-31 LAB — POCT PREGNANCY, URINE: Preg Test, Ur: NEGATIVE

## 2019-07-31 LAB — ABO/RH: ABO/RH(D): A POS

## 2019-07-31 SURGERY — XI ROBOTIC ASSISTED LAPAROSCOPIC HYSTERECTOMY AND SALPINGECTOMY
Anesthesia: General | Site: Urethra

## 2019-07-31 MED ORDER — PROPOFOL 10 MG/ML IV BOLUS
INTRAVENOUS | Status: DC | PRN
  Administered 2019-07-31: 120 mg via INTRAVENOUS

## 2019-07-31 MED ORDER — KETOROLAC TROMETHAMINE 30 MG/ML IJ SOLN
INTRAMUSCULAR | Status: AC
  Filled 2019-07-31: qty 1

## 2019-07-31 MED ORDER — GABAPENTIN 300 MG PO CAPS
300.0000 mg | ORAL_CAPSULE | ORAL | Status: AC
  Administered 2019-07-31: 300 mg via ORAL

## 2019-07-31 MED ORDER — ONDANSETRON HCL 4 MG/2ML IJ SOLN
INTRAMUSCULAR | Status: DC | PRN
  Administered 2019-07-31: 4 mg via INTRAVENOUS

## 2019-07-31 MED ORDER — MENTHOL 3 MG MT LOZG: 1.0000 | LOZENGE | OROMUCOSAL | Status: DC | PRN

## 2019-07-31 MED ORDER — FENTANYL CITRATE (PF) 100 MCG/2ML IJ SOLN
INTRAMUSCULAR | Status: DC | PRN
  Administered 2019-07-31 (×5): 50 ug via INTRAVENOUS

## 2019-07-31 MED ORDER — SENNOSIDES-DOCUSATE SODIUM 8.6-50 MG PO TABS
1.0000 | ORAL_TABLET | Freq: Every evening | ORAL | Status: DC | PRN
  Filled 2019-07-31: qty 1

## 2019-07-31 MED ORDER — CELECOXIB 200 MG PO CAPS
ORAL_CAPSULE | ORAL | Status: AC
  Filled 2019-07-31: qty 2

## 2019-07-31 MED ORDER — LIDOCAINE HCL (CARDIAC) PF 100 MG/5ML IV SOSY
PREFILLED_SYRINGE | INTRAVENOUS | Status: DC | PRN
  Administered 2019-07-31: 100 mg via INTRAVENOUS

## 2019-07-31 MED ORDER — CEFAZOLIN SODIUM-DEXTROSE 2-4 GM/100ML-% IV SOLN
2.0000 g | INTRAVENOUS | Status: AC
  Administered 2019-07-31: 2 g via INTRAVENOUS

## 2019-07-31 MED ORDER — ONDANSETRON HCL 4 MG PO TABS: 4.0000 mg | ORAL_TABLET | Freq: Four times a day (QID) | ORAL | Status: DC | PRN

## 2019-07-31 MED ORDER — SCOPOLAMINE 1 MG/3DAYS TD PT72
MEDICATED_PATCH | TRANSDERMAL | Status: AC
  Filled 2019-07-31: qty 1

## 2019-07-31 MED ORDER — OXYCODONE-ACETAMINOPHEN 5-325 MG PO TABS
ORAL_TABLET | ORAL | Status: AC
  Filled 2019-07-31: qty 1

## 2019-07-31 MED ORDER — KETOROLAC TROMETHAMINE 15 MG/ML IJ SOLN: 15.0000 mg | INTRAMUSCULAR | Status: DC

## 2019-07-31 MED ORDER — SODIUM CHLORIDE 0.9 % IV SOLN
INTRAVENOUS | Status: DC | PRN
  Administered 2019-07-31: 120 mL

## 2019-07-31 MED ORDER — LIDOCAINE 2% (20 MG/ML) 5 ML SYRINGE
INTRAMUSCULAR | Status: AC
  Filled 2019-07-31: qty 5

## 2019-07-31 MED ORDER — MEPERIDINE HCL 25 MG/ML IJ SOLN: 6.2500 mg | INTRAMUSCULAR | Status: DC | PRN

## 2019-07-31 MED ORDER — ROCURONIUM BROMIDE 100 MG/10ML IV SOLN
INTRAVENOUS | Status: DC | PRN
  Administered 2019-07-31: 80 mg via INTRAVENOUS

## 2019-07-31 MED ORDER — GABAPENTIN 300 MG PO CAPS
ORAL_CAPSULE | ORAL | Status: AC
  Filled 2019-07-31: qty 1

## 2019-07-31 MED ORDER — MIDAZOLAM HCL 5 MG/5ML IJ SOLN
INTRAMUSCULAR | Status: DC | PRN
  Administered 2019-07-31: 2 mg via INTRAVENOUS

## 2019-07-31 MED ORDER — SODIUM CHLORIDE 0.9 % IR SOLN
Status: DC | PRN
  Administered 2019-07-31: 3000 mL

## 2019-07-31 MED ORDER — KETOROLAC TROMETHAMINE 30 MG/ML IJ SOLN
30.0000 mg | Freq: Four times a day (QID) | INTRAMUSCULAR | Status: DC
  Administered 2019-07-31: 30 mg via INTRAVENOUS

## 2019-07-31 MED ORDER — MIDAZOLAM HCL 2 MG/2ML IJ SOLN
INTRAMUSCULAR | Status: AC
  Filled 2019-07-31: qty 2

## 2019-07-31 MED ORDER — ONDANSETRON HCL 4 MG/2ML IJ SOLN
4.0000 mg | Freq: Four times a day (QID) | INTRAMUSCULAR | Status: DC | PRN
  Administered 2019-07-31: 4 mg via INTRAVENOUS

## 2019-07-31 MED ORDER — KETOROLAC TROMETHAMINE 30 MG/ML IJ SOLN
INTRAMUSCULAR | Status: DC | PRN
  Administered 2019-07-31: 30 mg via INTRAVENOUS

## 2019-07-31 MED ORDER — ACETAMINOPHEN 500 MG PO TABS
1000.0000 mg | ORAL_TABLET | ORAL | Status: AC
  Administered 2019-07-31: 1000 mg via ORAL

## 2019-07-31 MED ORDER — ACETAMINOPHEN 500 MG PO TABS
ORAL_TABLET | ORAL | Status: AC
  Filled 2019-07-31: qty 2

## 2019-07-31 MED ORDER — OXYCODONE-ACETAMINOPHEN 5-325 MG PO TABS
1.0000 | ORAL_TABLET | ORAL | Status: DC | PRN
  Administered 2019-07-31 (×2): 1 via ORAL

## 2019-07-31 MED ORDER — PROPOFOL 10 MG/ML IV BOLUS
INTRAVENOUS | Status: AC
  Filled 2019-07-31: qty 40

## 2019-07-31 MED ORDER — ONDANSETRON HCL 4 MG/2ML IJ SOLN: 4.0000 mg | Freq: Once | INTRAMUSCULAR | Status: DC | PRN

## 2019-07-31 MED ORDER — ROCURONIUM BROMIDE 10 MG/ML (PF) SYRINGE
PREFILLED_SYRINGE | INTRAVENOUS | Status: AC
  Filled 2019-07-31: qty 10

## 2019-07-31 MED ORDER — CELECOXIB 200 MG PO CAPS
400.0000 mg | ORAL_CAPSULE | ORAL | Status: AC
  Administered 2019-07-31: 400 mg via ORAL

## 2019-07-31 MED ORDER — DEXAMETHASONE SODIUM PHOSPHATE 10 MG/ML IJ SOLN
INTRAMUSCULAR | Status: AC
  Filled 2019-07-31: qty 1

## 2019-07-31 MED ORDER — INDIGOTINDISULFONATE SODIUM 8 MG/ML IJ SOLN
INTRAMUSCULAR | Status: DC | PRN
Start: 2019-07-31 — End: 2019-07-31
  Administered 2019-07-31: 5 mL via INTRAVENOUS

## 2019-07-31 MED ORDER — SCOPOLAMINE 1 MG/3DAYS TD PT72
MEDICATED_PATCH | TRANSDERMAL | Status: DC | PRN
  Administered 2019-07-31: 1 via TRANSDERMAL

## 2019-07-31 MED ORDER — FENTANYL CITRATE (PF) 250 MCG/5ML IJ SOLN
INTRAMUSCULAR | Status: AC
  Filled 2019-07-31: qty 5

## 2019-07-31 MED ORDER — SUGAMMADEX SODIUM 200 MG/2ML IV SOLN
INTRAVENOUS | Status: DC | PRN
  Administered 2019-07-31: 200 mg via INTRAVENOUS

## 2019-07-31 MED ORDER — CEFAZOLIN SODIUM-DEXTROSE 2-4 GM/100ML-% IV SOLN
INTRAVENOUS | Status: AC
  Filled 2019-07-31: qty 100

## 2019-07-31 MED ORDER — HYDROMORPHONE HCL 1 MG/ML IJ SOLN: 0.2500 mg | INTRAMUSCULAR | Status: DC | PRN

## 2019-07-31 MED ORDER — IBUPROFEN 800 MG PO TABS: 800.0000 mg | ORAL_TABLET | Freq: Three times a day (TID) | ORAL | Status: DC

## 2019-07-31 MED ORDER — OXYCODONE-ACETAMINOPHEN 5-325 MG PO TABS: 1.0000 | ORAL_TABLET | ORAL | 0 refills | Status: DC | PRN

## 2019-07-31 MED ORDER — DEXAMETHASONE SODIUM PHOSPHATE 4 MG/ML IJ SOLN
INTRAMUSCULAR | Status: DC | PRN
  Administered 2019-07-31: 10 mg via INTRAVENOUS

## 2019-07-31 MED ORDER — ONDANSETRON HCL 4 MG/2ML IJ SOLN
INTRAMUSCULAR | Status: AC
  Filled 2019-07-31: qty 2

## 2019-07-31 MED ORDER — LACTATED RINGERS IV SOLN: INTRAVENOUS | Status: DC

## 2019-07-31 SURGICAL SUPPLY — 57 items
APL SRG 38 LTWT LNG FL B (MISCELLANEOUS) ×1
APPLICATOR ARISTA FLEXITIP XL (MISCELLANEOUS) ×4 IMPLANT
BARRIER ADHS 3X4 INTERCEED (GAUZE/BANDAGES/DRESSINGS) IMPLANT
BLADE SURG 10 STRL SS (BLADE) ×4 IMPLANT
COVER BACK TABLE 60X90IN (DRAPES) ×4 IMPLANT
COVER TIP SHEARS 8 DVNC (MISCELLANEOUS) ×2 IMPLANT
COVER TIP SHEARS 8MM DA VINCI (MISCELLANEOUS) ×4
DECANTER SPIKE VIAL GLASS SM (MISCELLANEOUS) ×4 IMPLANT
DEFOGGER SCOPE WARMER CLEARIFY (MISCELLANEOUS) ×4 IMPLANT
DERMABOND ADVANCED (GAUZE/BANDAGES/DRESSINGS) ×2
DERMABOND ADVANCED .7 DNX12 (GAUZE/BANDAGES/DRESSINGS) ×2 IMPLANT
DRAPE ARM DVNC X/XI (DISPOSABLE) ×8 IMPLANT
DRAPE COLUMN DVNC XI (DISPOSABLE) ×2 IMPLANT
DRAPE DA VINCI XI ARM (DISPOSABLE) ×16
DRAPE DA VINCI XI COLUMN (DISPOSABLE) ×4
DURAPREP 26ML APPLICATOR (WOUND CARE) ×4 IMPLANT
ELECT REM PT RETURN 9FT ADLT (ELECTROSURGICAL) ×4
ELECTRODE REM PT RTRN 9FT ADLT (ELECTROSURGICAL) ×2 IMPLANT
GLOVE BIO SURGEON STRL SZ 6.5 (GLOVE) ×15 IMPLANT
GLOVE BIO SURGEON STRL SZ7 (GLOVE) ×12 IMPLANT
GLOVE BIO SURGEONS STRL SZ 6.5 (GLOVE) ×5
GLOVE BIOGEL PI IND STRL 7.0 (GLOVE) ×10 IMPLANT
GLOVE BIOGEL PI IND STRL 7.5 (GLOVE) ×2 IMPLANT
GLOVE BIOGEL PI INDICATOR 7.0 (GLOVE) ×10
GLOVE BIOGEL PI INDICATOR 7.5 (GLOVE) ×2
GLOVE ECLIPSE 7.5 STRL STRAW (GLOVE) ×4 IMPLANT
HEMOSTAT ARISTA ABSORB 3G PWDR (HEMOSTASIS) ×4 IMPLANT
IRRIG SUCT STRYKERFLOW 2 WTIP (MISCELLANEOUS) ×4
IRRIGATION SUCT STRKRFLW 2 WTP (MISCELLANEOUS) ×2 IMPLANT
LEGGING LITHOTOMY PAIR STRL (DRAPES) ×4 IMPLANT
MANIFOLD NEPTUNE II (INSTRUMENTS) ×4 IMPLANT
MANIPULATOR ADVINCU DEL 3.5 PL (MISCELLANEOUS) ×4 IMPLANT
NEEDLE INSUFFLATION 120MM (ENDOMECHANICALS) ×4 IMPLANT
NS IRRIG 500ML POUR BTL (IV SOLUTION) ×4 IMPLANT
OBTURATOR OPTICAL STANDARD 8MM (TROCAR) ×4
OBTURATOR OPTICAL STND 8 DVNC (TROCAR) ×2
OBTURATOR OPTICALSTD 8 DVNC (TROCAR) ×2 IMPLANT
PACK ROBOT WH (CUSTOM PROCEDURE TRAY) ×4 IMPLANT
PACK ROBOTIC GOWN (GOWN DISPOSABLE) ×4 IMPLANT
PACK TRENDGUARD 450 HYBRID PRO (MISCELLANEOUS) ×2 IMPLANT
PAD PREP 24X48 CUFFED NSTRL (MISCELLANEOUS) ×4 IMPLANT
POUCH LAPAROSCOPIC INSTRUMENT (MISCELLANEOUS) ×4 IMPLANT
PROTECTOR NERVE ULNAR (MISCELLANEOUS) ×8 IMPLANT
SEAL CANN UNIV 5-8 DVNC XI (MISCELLANEOUS) ×6 IMPLANT
SEAL XI 5MM-8MM UNIVERSAL (MISCELLANEOUS) ×12
SET IRRIG Y TYPE TUR BLADDER L (SET/KITS/TRAYS/PACK) ×4 IMPLANT
SET TRI-LUMEN FLTR TB AIRSEAL (TUBING) ×4 IMPLANT
SUT VIC AB 0 CT1 36 (SUTURE) ×4 IMPLANT
SUT VIC AB 2-0 UR6 27 (SUTURE) ×4 IMPLANT
SUT VICRYL RAPIDE 3 0 (SUTURE) ×8 IMPLANT
SUT VLOC 180 0 9IN  GS21 (SUTURE) ×4
SUT VLOC 180 0 9IN GS21 (SUTURE) ×2 IMPLANT
TOWEL OR 17X26 10 PK STRL BLUE (TOWEL DISPOSABLE) ×8 IMPLANT
TRAY FOLEY W/BAG SLVR 14FR (SET/KITS/TRAYS/PACK) ×4 IMPLANT
TRENDGUARD 450 HYBRID PRO PACK (MISCELLANEOUS) ×4
TROCAR BLADELESS OPT 5 100 (ENDOMECHANICALS) IMPLANT
TROCAR PORT AIRSEAL 5X120 (TROCAR) ×4 IMPLANT

## 2019-07-31 NOTE — Progress Notes (Signed)
There has been no change in the patients history, status or exam since the history and physical.  Vitals:   07/31/19 0609  BP: (!) 142/95  Pulse: 79  Resp: 18  Temp: 97.6 F (36.4 C)  TempSrc: Oral  SpO2: 100%  Weight: 87.6 kg  Height: 5\' 7"  (1.702 m)    Results for orders placed or performed during the hospital encounter of 07/31/19 (from the past 72 hour(s))  Pregnancy, urine POC     Status: None   Collection Time: 07/31/19  5:39 AM  Result Value Ref Range   Preg Test, Ur NEGATIVE NEGATIVE    Comment:        THE SENSITIVITY OF THIS METHODOLOGY IS >24 mIU/mL     Tracey Taylor

## 2019-07-31 NOTE — Discharge Summary (Signed)
Physician Discharge Summary  Patient ID: Tracey Taylor MRN: [id number] DOB/AGE: 06-02-1970 49 y.o.  Admit date: 07/31/2019 Discharge date: 07/31/2019  Admission Diagnoses:Symptomatic fibroid uterus.   Discharge Diagnoses: same. Active Problems:   Postoperative state   Discharged Condition: good  Hospital Course: Uncomplicated robotic TLH, salpingectomies    Consults: None  Significant Diagnostic Studies: labs: none  Treatments: surgery:  Uncomplicated robotic TLH, salpingectomies    Discharge Exam: Blood pressure 127/85, pulse 72, temperature 98.2 F (36.8 C), resp. rate 16, height 5\' 7"  (1.702 m), weight 87.6 kg, last menstrual period 07/05/2019, SpO2 97 %.   Disposition:    Allergies as of 07/31/2019      Reactions   Gadolinium Derivatives Hives, Itching   Itching and one hive on right deltoid area.        Medication List    STOP taking these medications   traMADol 50 MG tablet Commonly known as: ULTRAM     TAKE these medications   aspirin-acetaminophen-caffeine 250-250-65 MG tablet Commonly known as: EXCEDRIN MIGRAINE Take 2 tablets by mouth every 6 (six) hours as needed for headache.   cetirizine 10 MG tablet Commonly known as: ZYRTEC Take 10 mg by mouth daily.   oxyCODONE-acetaminophen 5-325 MG tablet Commonly known as: PERCOCET/ROXICET Take 1 tablet by mouth every 4 (four) hours as needed for severe pain.       Follow-up Information    Bobbye Charleston, MD Follow up in 2 week(s).   Specialty: Obstetrics and Gynecology Contact information: [address] [place] [address] [phone]               Signed: Daria Pastures 07/31/2019, 12:59 PM

## 2019-07-31 NOTE — Op Note (Signed)
07/31/2019  9:49 AM  PATIENT:  Iona Coach Doggett  49 y.o. female  PRE-OPERATIVE DIAGNOSIS:  fibroids  POST-OPERATIVE DIAGNOSIS:  fibroids  PROCEDURE:  Procedure(s): XI ROBOTIC ASSISTED LAPAROSCOPIC HYSTERECTOMY AND SALPINGECTOMY (Bilateral) CYSTOSCOPY (N/A)  SURGEON:  Surgeon(s) and Role:    * Bobbye Charleston, MD - Primary    * Taam-Akelman, Lawrence Santiago, MD - Assisting  ANESTHESIA:   general  EBL:  100 mL   LOCAL MEDICATIONS USED:  OTHER Ropivicaine, Arista, Indigo carmine  SPECIMEN:  Source of Specimen:  Uterus, cervix, portions of bilateral tubes  DISPOSITION OF SPECIMEN:  PATHOLOGY  COUNTS:  YES  TOURNIQUET:  * No tourniquets in log *  DICTATION: .Note written in EPIC  PLAN OF CARE: Admit for overnight observation  PATIENT DISPOSITION:  PACU - hemodynamically stable.   Delay start of Pharmacological VTE agent (>24hrs) due to surgical blood loss or risk of bleeding: not applicable   Complications:  None.  Findings:  17 weeks size uterus- 650 gm after removal.  Portions of Left tube still in place.  Ovaries were normal.  The ureters were identified during multiple points of the case and were always out of the field of dissection.  On cystoscopy, the bladder was intact and bilateral spill was seen from each ureteral orriface.       Technique:   After adequate anesthesia was achieved the patient was positioned, prepped and draped in usual sterile fashion.  A speculum was placed in the vagina and the cervix dilated with pratt dilators.  The 3.5 cm Koh ring Advincula was assembled and placed in proper fashion.  The  Speculum was removed and the bladder catheterized with a foley.     Attention was turned to the abdomen where a 1 cm incision was made 1 cm above the umbilicus.  The veress needle was introduced without aspiration of bowel contents or blood and the abdomen insufflated. The 8.5 mm Robotic trocar was placed and the other three trocar sites were marked out,  all approximately 10 cm from each other and the camera.  Two 8.5 mm trocars were placed on either side of the camera port and a 5 mm assistant port was placed 3 cm above the line of the other trocars.  All trocars were inserted under direct visualization of the camera.  The patient was placed in trendelenburg and then the Robot docked.  The fenestrated bipolar were placed on arm 1 and the Hot shears on arm 3 and introduced under direct visualization of the camera.   I then broke scrub and sat down at the console.  The above findings were noted and the ureters identified well out of the field of dissection.  The right fallopian tube was isolated and cauterized with the bipolar.  The Utero-ovarian ligament was then divided with the bipolar cautery and shears.  The posterior broad ligament was then divided with the hot shears until the uterosacral ligament.  The Broad and cardinal ligaments were then cauterized against the cervix to the level of the Koh ring, securing the uterine artery.  Each pedicle was then incised with the shears.  The anterior leaf was then incised at the reflection of the vessico-uterine junction and the lateral bladder retracted inferiorly after the round ligament had been divided with the bipolar forceps.  The portions of left tube mesosalpinx were cauterized with the bipolar and divided with the shears;  then the left utero-ovarian ligament divided with the bipolar forceps and the scissors.  The round  ligament was divided as well and the posterior leaf of the broad ligament then divided with the hot shears. The broad and cardinal ligaments were then cauterized on the left in the same way.   At the level of the internal os, the uterine arteries were bilaterally cauterized with the bipolar.  The ureters were identified well out of the field of dissection.     The bladder was then able to be retracted inferiorly and the vesico-uterine fascia was incised in the midline until the bladder was  removed one cm below the Koh ring.  The hot shears then circumferentially incised the vagina at the level of the reflection on the [facility] ring.  Once the uterus and cervix were amputated, I scubbed back in and removed portions of the uterus and some fibroids with the scalpel until the rest of the uterus and tubes were able to be removed directly through the vagina.   Cautery was used to insure hemostasis of the cuff.  Once hemostasis was achieved, the scissors were changed to the mega suture cut needle driver and the cuff was closed with a running stitches of 0-vicryl V loc.  Cautery was used to ensure hemostasis of the left pedicles very superficially. The ureters were peristalsing bilaterally well and very lateral to the areas of operation.     The Robot was then undocked and I scrubbed back in.  The needle was removed and Ropivicaine was introduced into the pelvis. The skin incisions were closed with subcuticular stitches of 3-0 vicryl Rapide and Dermabond.  All instruments were removed from the vagina and cystoscopy performed, revealing an intact bladder and vigourous spill of urine from each ureteral orifice.  The cystoscope was removed and the patient taken to the recovery room in stable condition.   Karnell Vanderloop A

## 2019-07-31 NOTE — Transfer of Care (Signed)
Immediate Anesthesia Transfer of Care Note  Patient: Tracey Taylor  Procedure(s) Performed: Procedure(s) (LRB): XI ROBOTIC ASSISTED LAPAROSCOPIC HYSTERECTOMY AND SALPINGECTOMY (Bilateral) CYSTOSCOPY (N/A)  Patient Location: PACU  Anesthesia Type: General  Level of Consciousness: awake, sedated, patient cooperative and responds to stimulation  Airway & Oxygen Therapy: Patient Spontanous Breathing and Patient connected to [place] 02 and soft FM   Post-op Assessment: Report given to PACU RN, Post -op Vital signs reviewed and stable and Patient moving all extremities  Post vital signs: Reviewed and stable  Complications: No apparent anesthesia complications

## 2019-07-31 NOTE — Anesthesia Preprocedure Evaluation (Signed)
Anesthesia Evaluation  Patient identified by MRN, date of birth, ID band Patient awake    Reviewed: Allergy & Precautions, NPO status , Patient's Chart, lab work & pertinent test results  Airway Mallampati: II  TM Distance: >3 FB Neck ROM: Full    Dental   Pulmonary    Pulmonary exam normal        Cardiovascular Normal cardiovascular exam     Neuro/Psych    GI/Hepatic   Endo/Other    Renal/GU      Musculoskeletal   Abdominal   Peds  Hematology   Anesthesia Other Findings   Reproductive/Obstetrics                             Anesthesia Physical Anesthesia Plan  ASA: II  Anesthesia Plan: General   Post-op Pain Management:    Induction: Intravenous  PONV Risk Score and Plan: 3 and Ondansetron, Dexamethasone and Midazolam  Airway Management Planned: Oral ETT  Additional Equipment:   Intra-op Plan:   Post-operative Plan: Extubation in OR  Informed Consent: I have reviewed the patients History and Physical, chart, labs and discussed the procedure including the risks, benefits and alternatives for the proposed anesthesia with the patient or authorized representative who has indicated his/her understanding and acceptance.       Plan Discussed with: CRNA and Surgeon  Anesthesia Plan Comments:         Anesthesia Quick Evaluation

## 2019-07-31 NOTE — Anesthesia Postprocedure Evaluation (Signed)
Anesthesia Post Note  Patient: Tracey Taylor  Procedure(s) Performed: XI ROBOTIC ASSISTED LAPAROSCOPIC HYSTERECTOMY AND SALPINGECTOMY (Bilateral Abdomen) CYSTOSCOPY (N/A Urethra)     Patient location during evaluation: PACU Anesthesia Type: General Level of consciousness: awake and alert Pain management: pain level controlled Vital Signs Assessment: post-procedure vital signs reviewed and stable Respiratory status: spontaneous breathing, nonlabored ventilation, respiratory function stable and patient connected to nasal cannula oxygen Cardiovascular status: blood pressure returned to baseline and stable Postop Assessment: no apparent nausea or vomiting Anesthetic complications: no   No complications documented.  Last Vitals:  Vitals:   07/31/19 1125 07/31/19 1216  BP: 133/86 127/85  Pulse: 74 72  Resp: 16 16  Temp:  36.8 C  SpO2: 95% 97%    Last Pain:  Vitals:   07/31/19 1058  TempSrc:   PainSc: 5                  Farid Grigorian DAVID

## 2019-07-31 NOTE — Anesthesia Procedure Notes (Signed)
Procedure Name: Intubation Date/Time: 07/31/2019 7:39 AM Performed by: Justice Rocher, CRNA Pre-anesthesia Checklist: Patient identified, Emergency Drugs available, Suction available, Patient being monitored and Timeout performed Patient Re-evaluated:Patient Re-evaluated prior to induction Oxygen Delivery Method: Circle system utilized Preoxygenation: Pre-oxygenation with 100% oxygen Induction Type: IV induction Ventilation: Mask ventilation without difficulty Laryngoscope Size: Mac and 3 Grade View: Grade II Tube type: Oral Tube size: 7.5 mm Number of attempts: 1 Airway Equipment and Method: Stylet and Oral airway Placement Confirmation: ETT inserted through vocal cords under direct vision,  positive ETCO2,  breath sounds checked- equal and bilateral and CO2 detector Secured at: 23 cm Tube secured with: Tape Dental Injury: Teeth and Oropharynx as per pre-operative assessment

## 2019-07-31 NOTE — Brief Op Note (Signed)
07/31/2019  9:49 AM  PATIENT:  Tracey Taylor  49 y.o. female  PRE-OPERATIVE DIAGNOSIS:  fibroids  POST-OPERATIVE DIAGNOSIS:  fibroids  PROCEDURE:  Procedure(s): XI ROBOTIC ASSISTED LAPAROSCOPIC HYSTERECTOMY AND SALPINGECTOMY (Bilateral) CYSTOSCOPY (N/A)  SURGEON:  Surgeon(s) and Role:    * Bobbye Charleston, MD - Primary    * Taam-Akelman, Lawrence Santiago, MD - Assisting  ANESTHESIA:   general  EBL:  100 mL   LOCAL MEDICATIONS USED:  OTHER Ropivicaine, Arista, Indigo carmine  SPECIMEN:  Source of Specimen:  Uterus, cervix, portions of bilateral tubes  DISPOSITION OF SPECIMEN:  PATHOLOGY  COUNTS:  YES  TOURNIQUET:  * No tourniquets in log *  DICTATION: .Note written in EPIC  PLAN OF CARE: Admit for overnight observation  PATIENT DISPOSITION:  PACU - hemodynamically stable.   Delay start of Pharmacological VTE agent (>24hrs) due to surgical blood loss or risk of bleeding: not applicable

## 2019-08-01 ENCOUNTER — Encounter (HOSPITAL_BASED_OUTPATIENT_CLINIC_OR_DEPARTMENT_OTHER): Payer: Self-pay | Admitting: Obstetrics and Gynecology

## 2019-08-02 LAB — SURGICAL PATHOLOGY

## 2019-08-14 ENCOUNTER — Encounter: Payer: Self-pay | Admitting: *Deleted

## 2020-07-30 LAB — HM PAP SMEAR

## 2020-07-30 LAB — HM MAMMOGRAPHY

## 2020-07-30 LAB — RESULTS CONSOLE HPV: CHL HPV: NEGATIVE

## 2020-08-05 ENCOUNTER — Encounter: Payer: Self-pay | Admitting: Physician Assistant

## 2021-03-28 IMAGING — DX DG CHEST 1V PORT
1 series · 1 of 1 positions shown · non-contrast
Comparison: December 14, 2004

CLINICAL DATA: Chest pain

EXAM:
PORTABLE CHEST 1 VIEW

[chest ap]
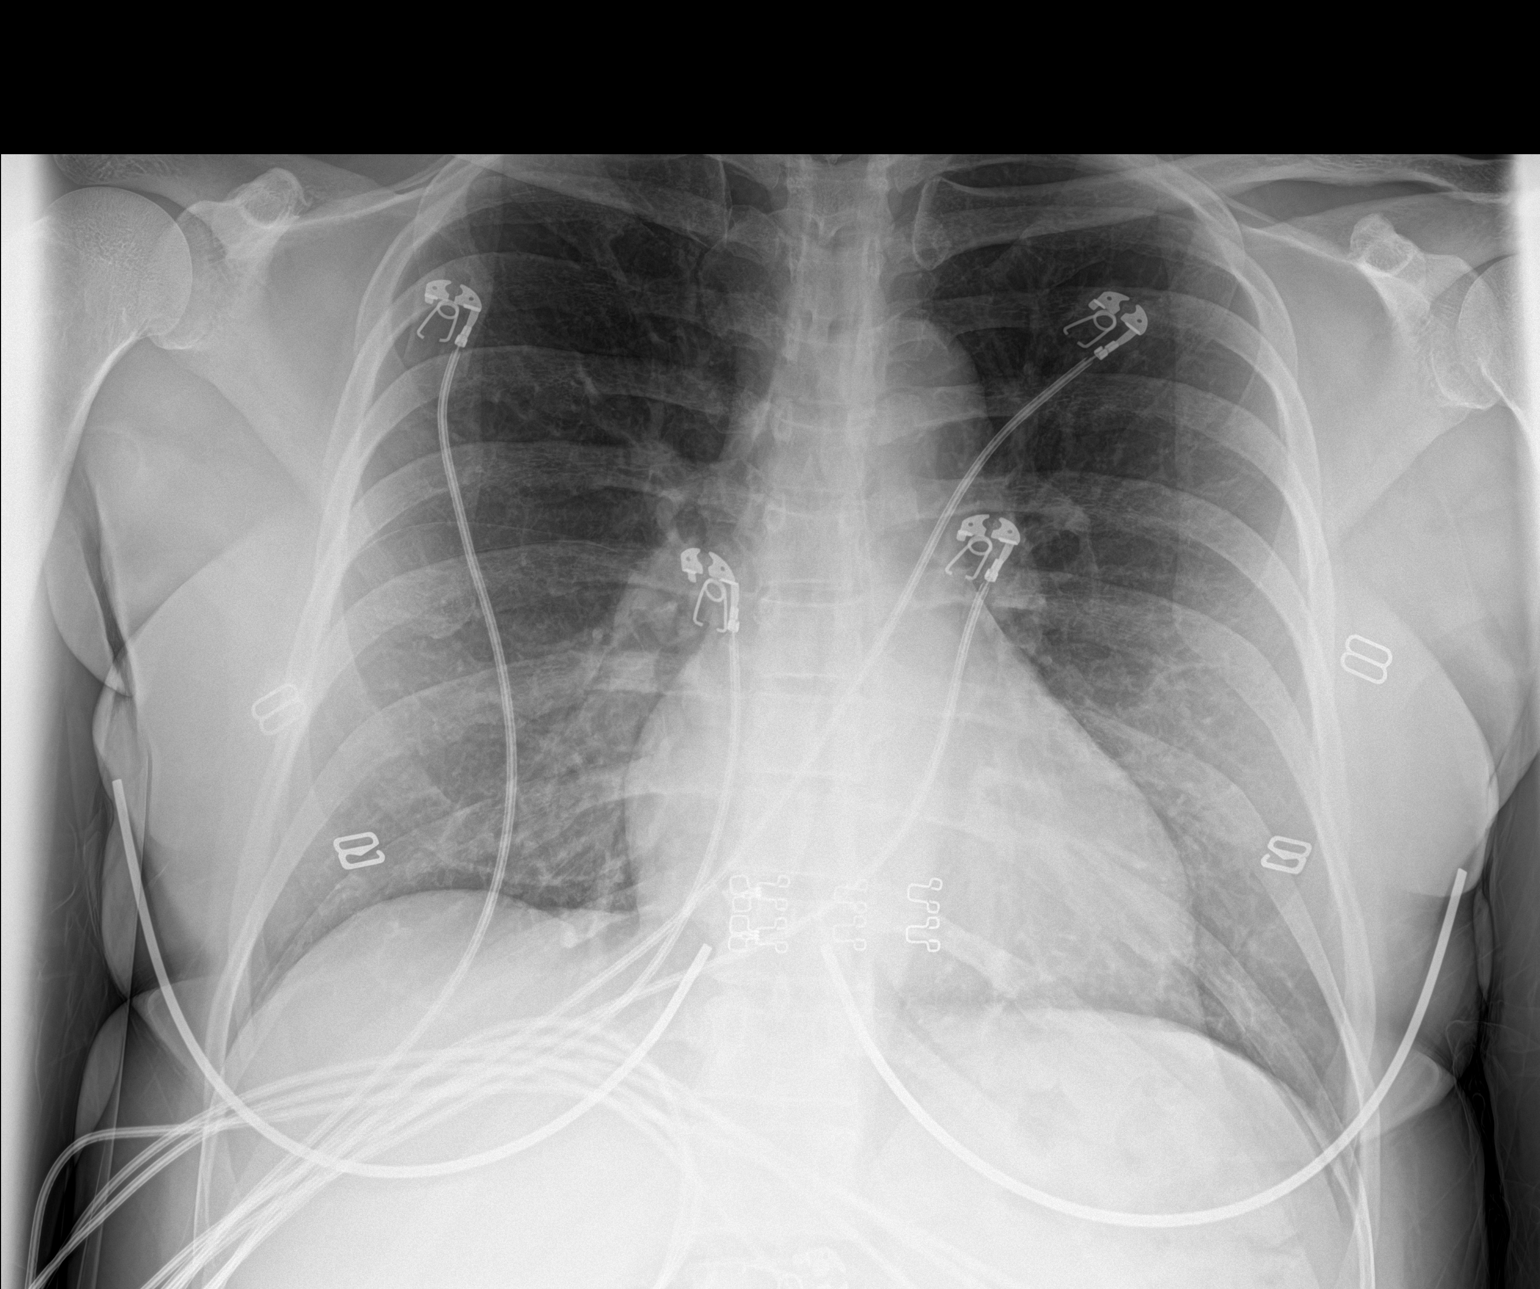

[1 of 1 positions shown; findings below may reference images not displayed]

FINDINGS: The heart size and mediastinal contours are within normal limits.
Both lungs are clear. The visualized skeletal structures are
unremarkable.
IMPRESSION: No active disease.

## 2021-03-28 IMAGING — US US TRANSVAGINAL NON-OB
1 series · 14 of 25 positions shown · non-contrast
Comparison: None

CLINICAL DATA: Right adnexal pain and tenderness



[Series 1: us transvaginal non-ob · 88 acquisitions, 14 frames shown]
[im 1/88]
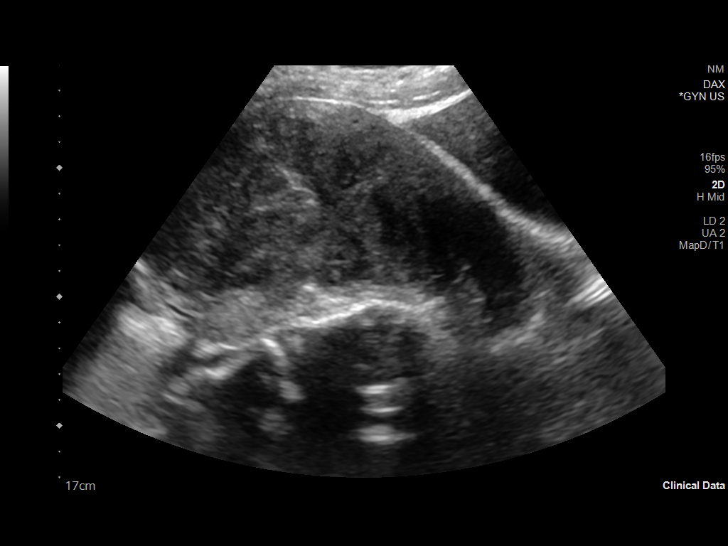
[im 8/88]
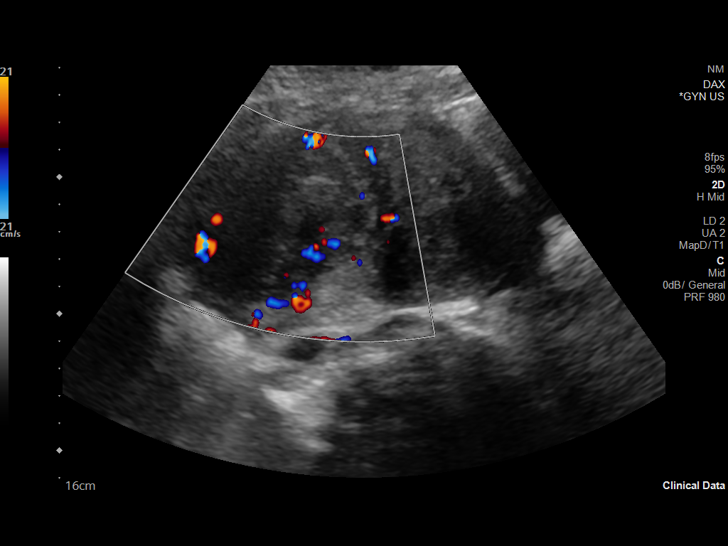
[im 15/88]
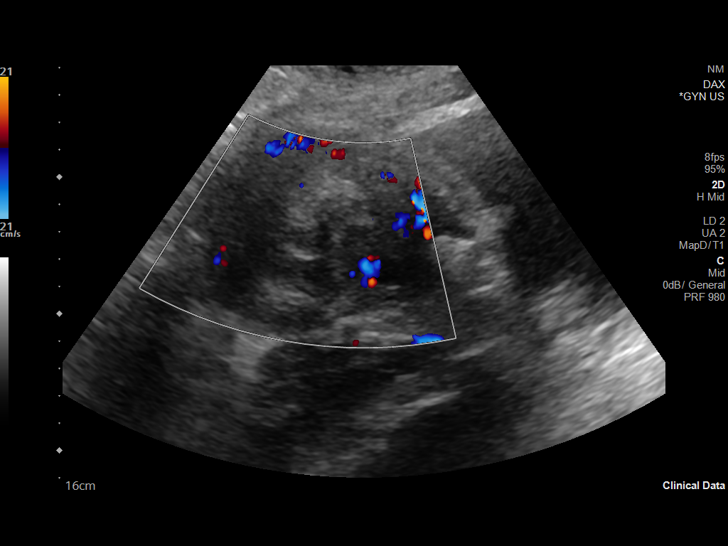
[im 22/88]
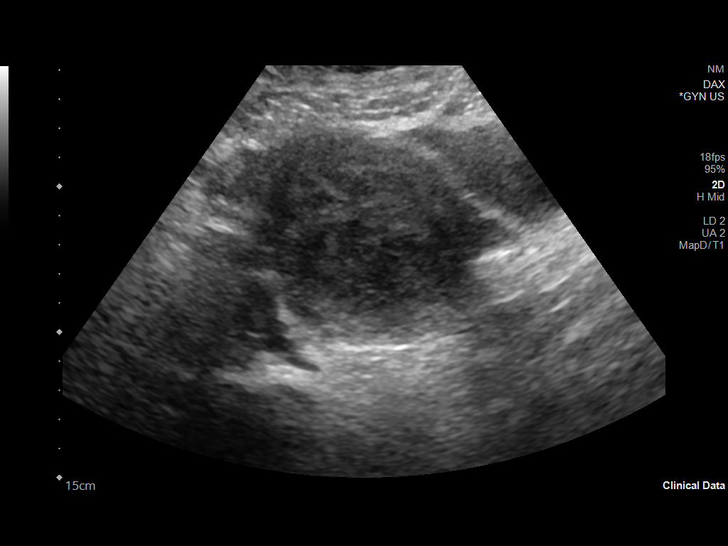
[im 30/88]
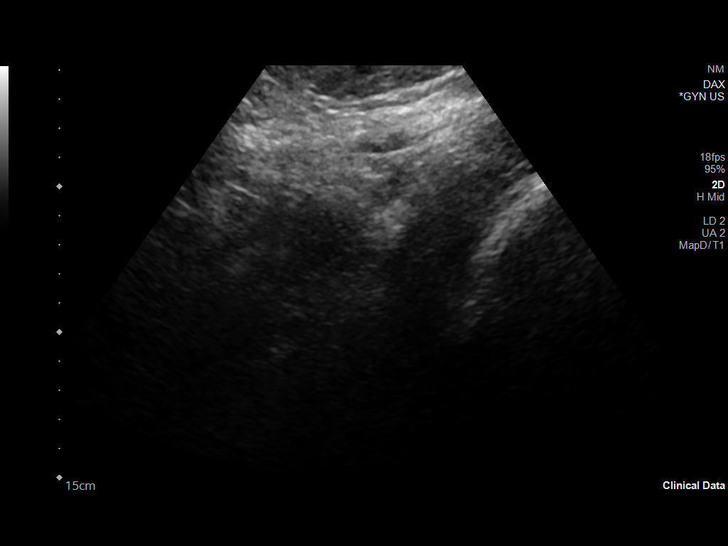
[im 33/88]
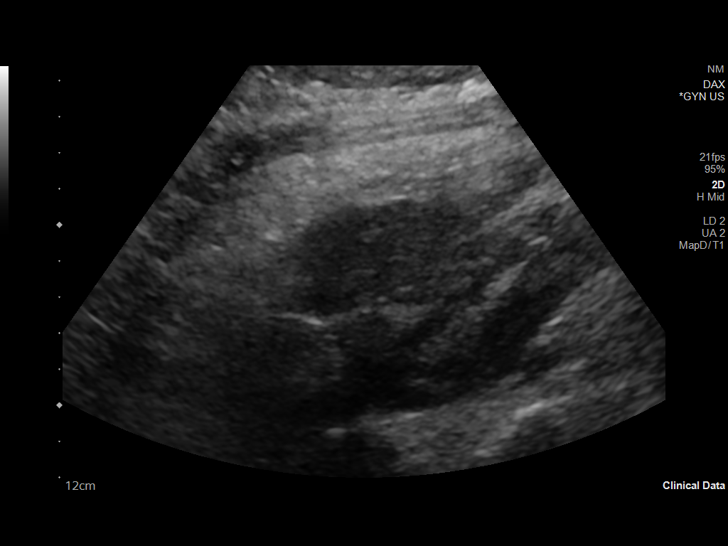
[im 40/88]
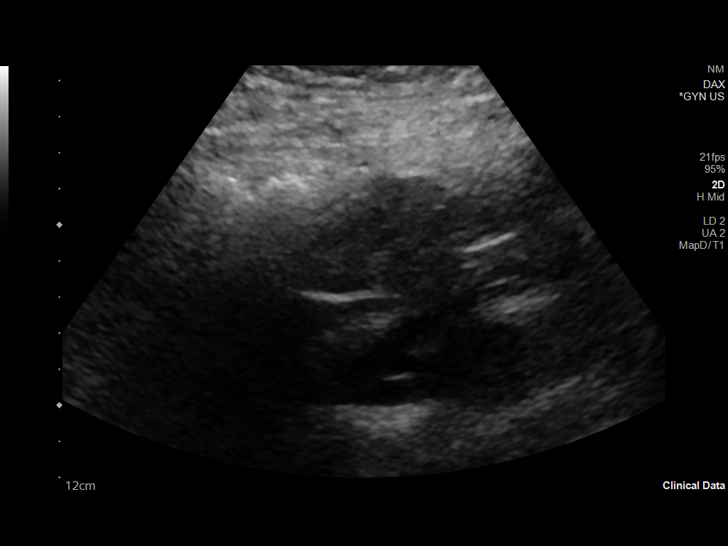
[im 48/88]
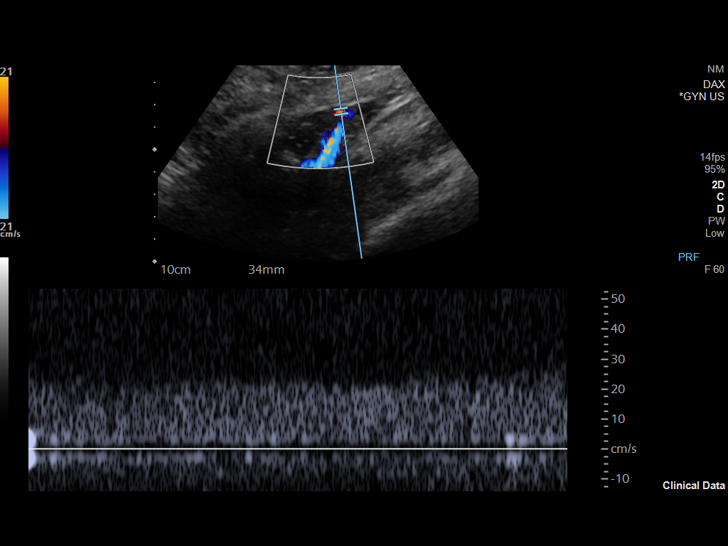
[im 55/88]
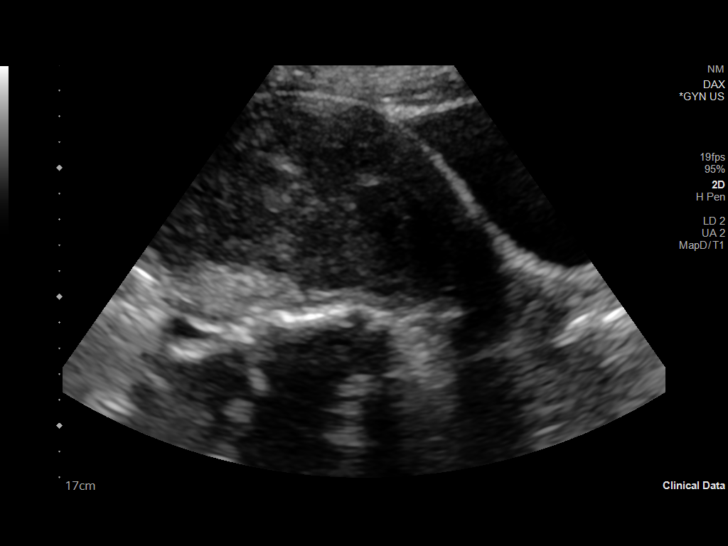
[im 59/88]
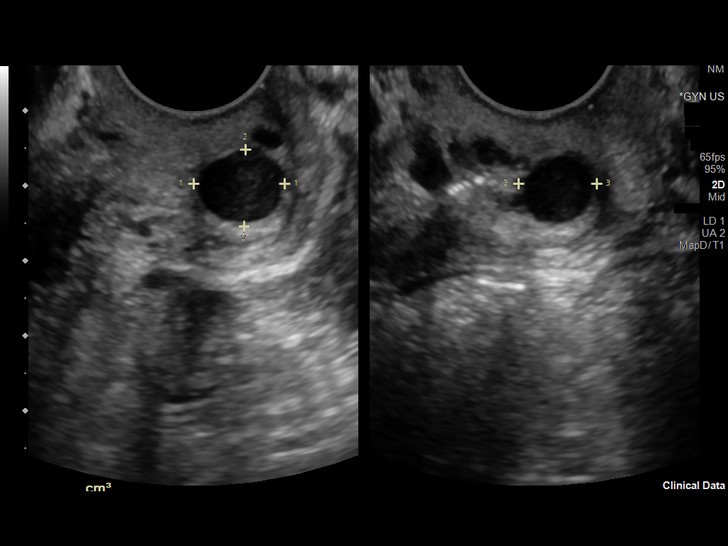
[im 66/88]
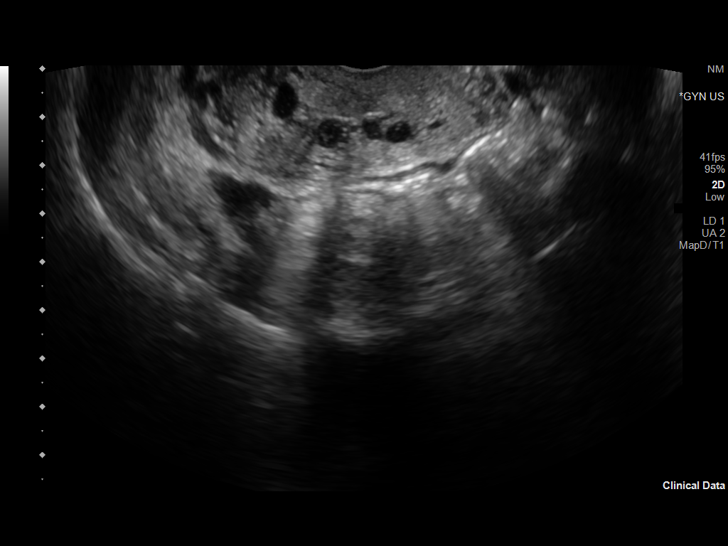
[im 73/88]
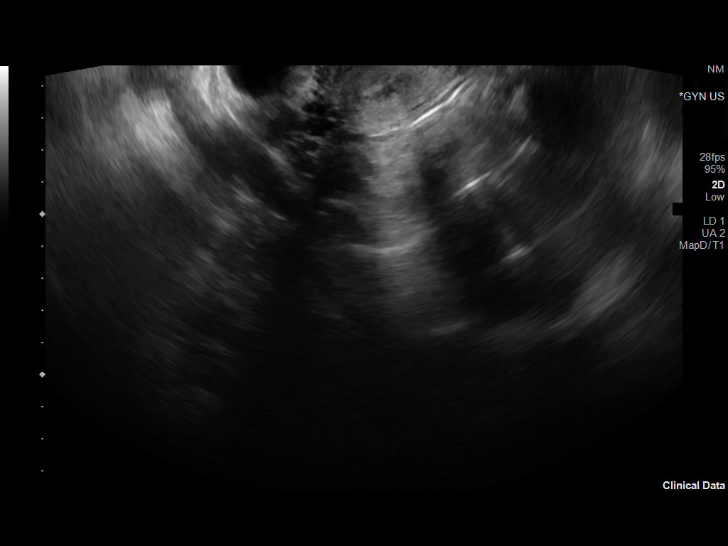
[im 80/88]
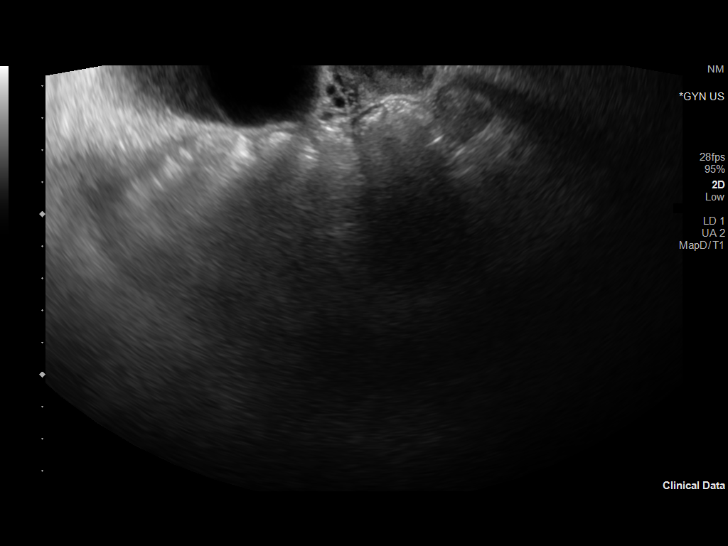
[im 88/88]
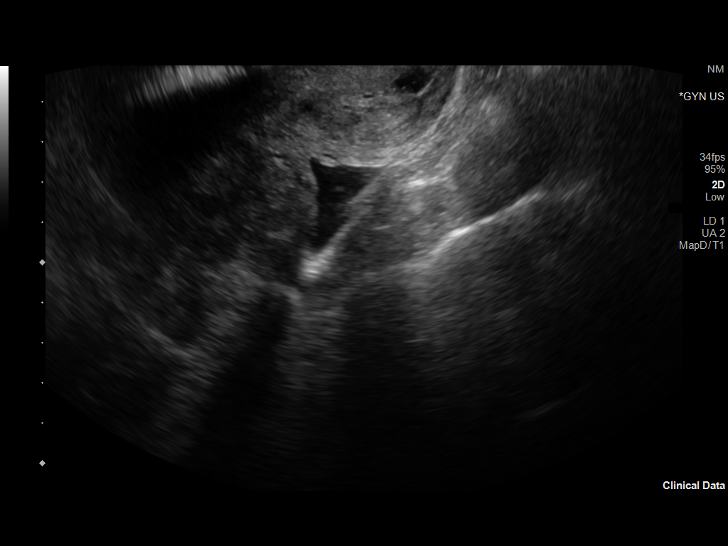

[14 of 25 positions shown; findings below may reference images not displayed]

FINDINGS: Uterus

Measurements: 16.9 x 8.0 x 11.2 = volume: 72 mL. There are multiple
heterogeneously hypoechoic intrauterine fibroids seen throughout.
The largest in the posterior right fundus measuring 7.1 x 6.2 x
cm.

Endometrium

Thickness: The endometrium is not well visualized.

Right ovary

Measurements: 4.8 x 3.0 x 3.6 cm = volume: 26 mL. Normal
appearance/no adnexal mass.

Left ovary

Measurements: 4.2 x 1.9 x 2.5 cm = volume: 10 mL. Normal
appearance/no adnexal mass.

Other findings

No abnormal free fluid.
IMPRESSION: Multiple intrauterine fibroids. The largest measures 7.1 x 6.2 x
cm in the posterior right fundus.

Normal appearing ovaries.

## 2021-03-28 IMAGING — CT CT ABD-PELV W/ CM
2 of 5 series · 16 of 46 positions shown, 18 images · IV contrast (APPLIED)
Comparison: None.

CLINICAL DATA: Abdominal distention. Right-sided flank pain.

EXAM:
CT ABDOMEN AND PELVIS WITH CONTRAST
TECHNIQUE: Multidetector CT imaging of the abdomen and pelvis was performed
using the standard protocol following bolus administration of
intravenous contrast.
CONTRAST:  100mL OMNIPAQUE IOHEXOL 300 MG/ML  SOLN

[Series 3: abd/ pelvis 5.0 i30f 2 · axial · 0.96mm/px · z∈[+953,+1413]mm · 13 of 104 slices shown, 15 images]
[im 6/104  soft-tissue]
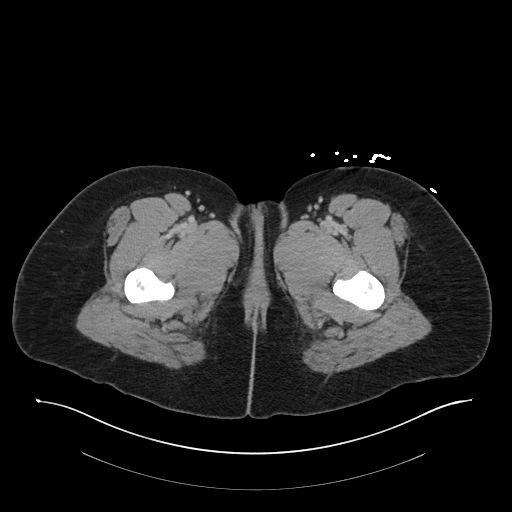
[im 6/104  bone]
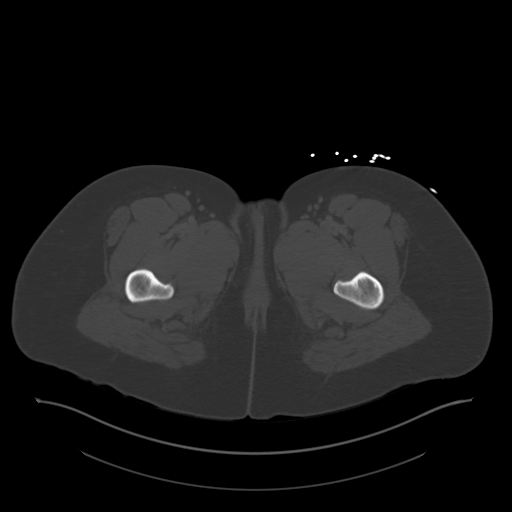
[im 12/104  soft-tissue]
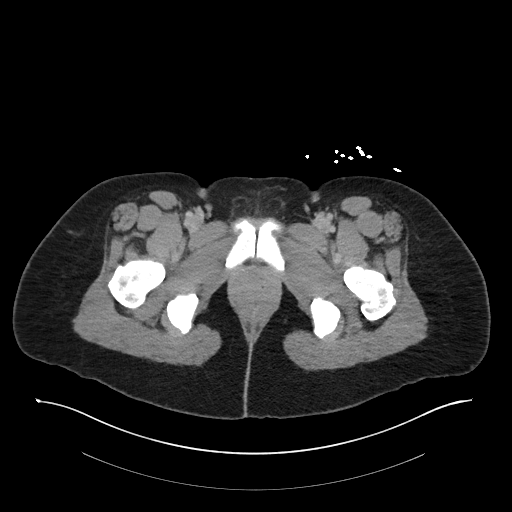
[im 23/104  soft-tissue]
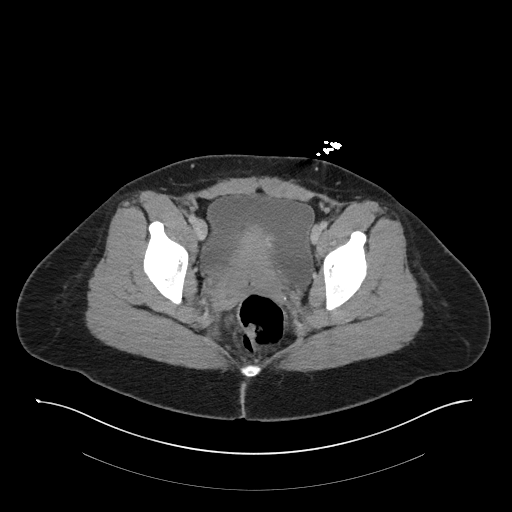
[im 29/104  soft-tissue]
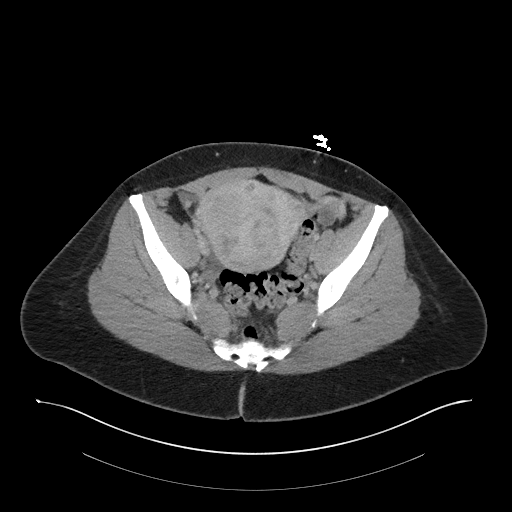
[im 35/104  soft-tissue]
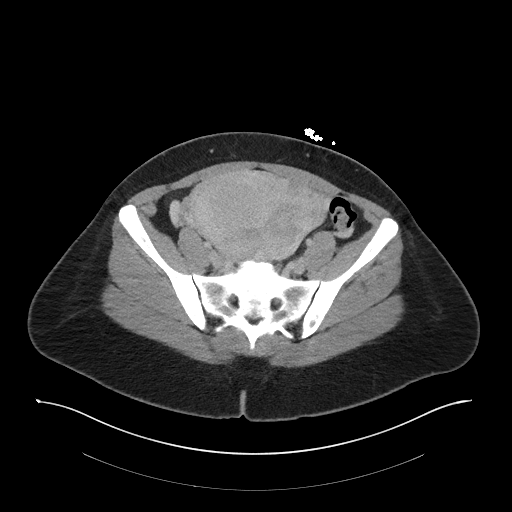
[im 46/104  soft-tissue]
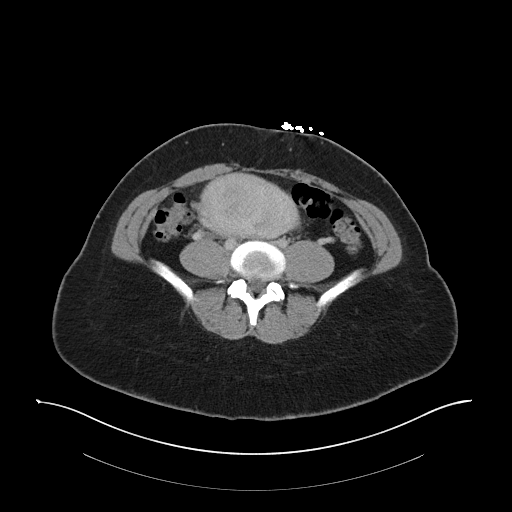
[im 52/104  soft-tissue]
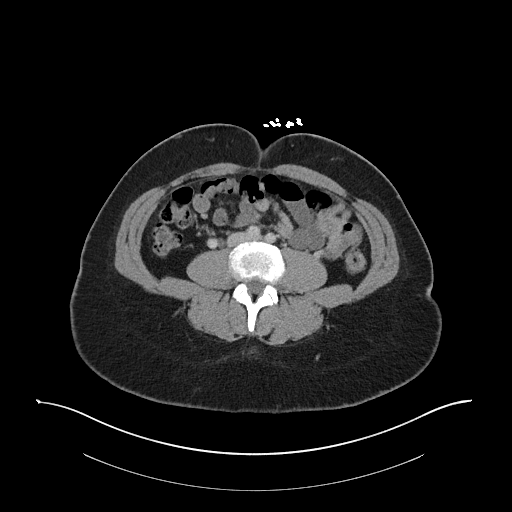
[im 58/104  soft-tissue]
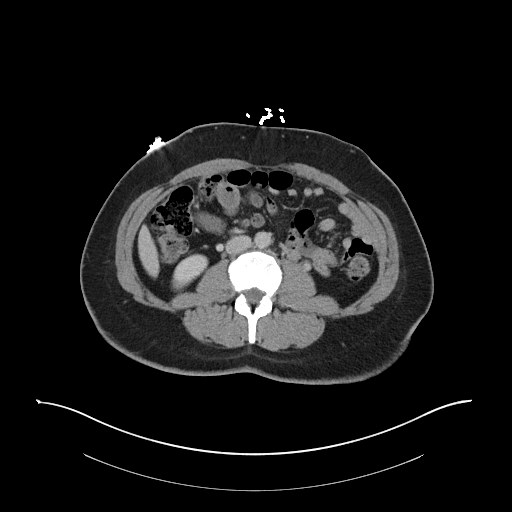
[im 69/104  soft-tissue]
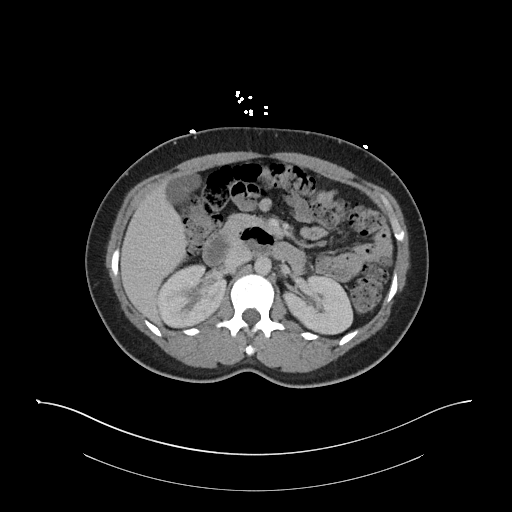
[im 69/104  bone]
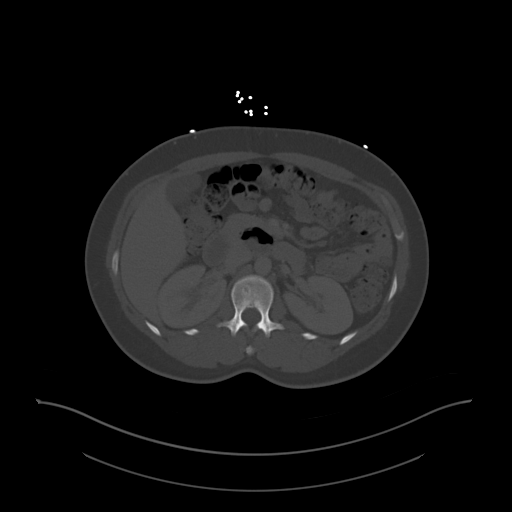
[im 75/104  soft-tissue]
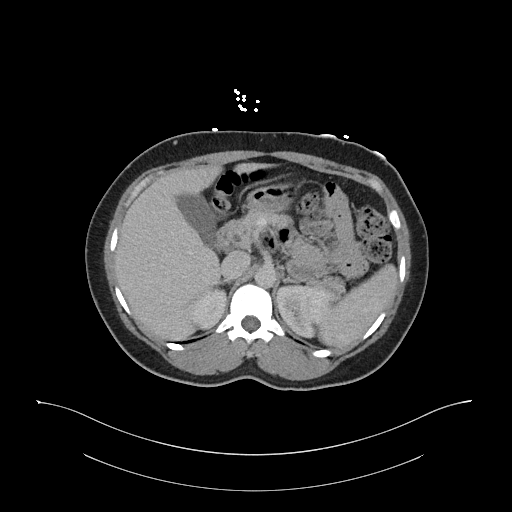
[im 81/104  soft-tissue]
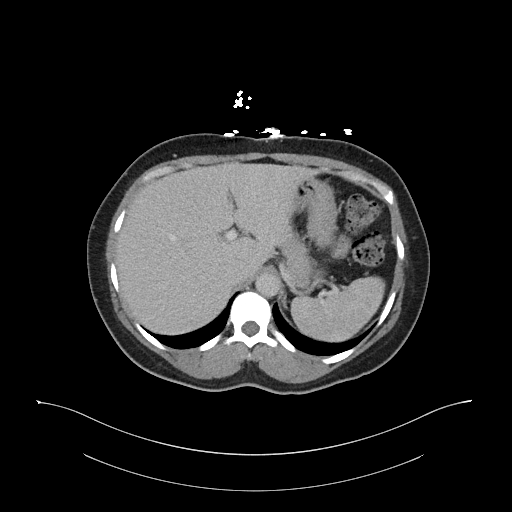
[im 92/104  soft-tissue]
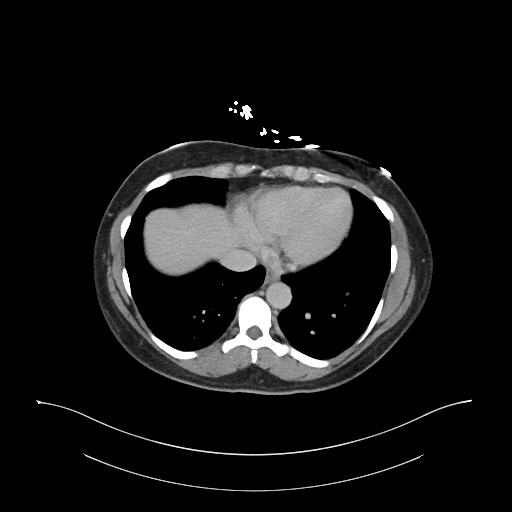
[im 98/104  soft-tissue]
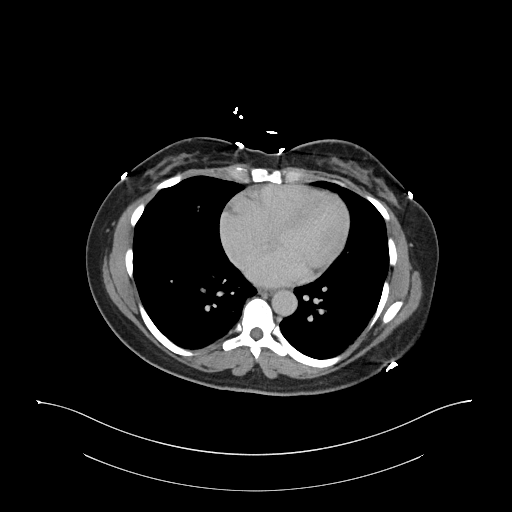

[Series 6: coronal soft tissue · coronal · 1.01mm/px · 3 of 109 slices shown]
[im 37/109  soft-tissue]
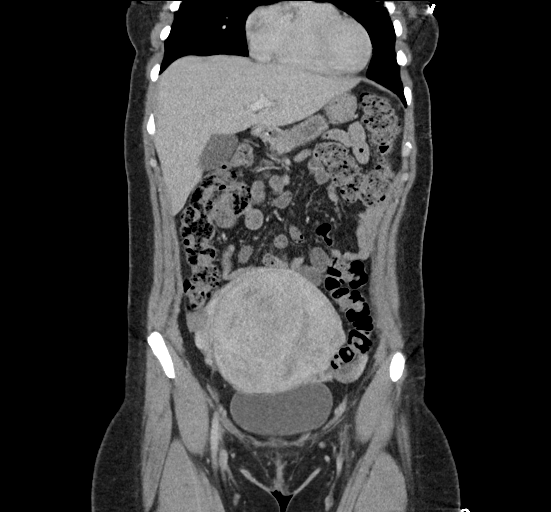
[im 49/109  soft-tissue]
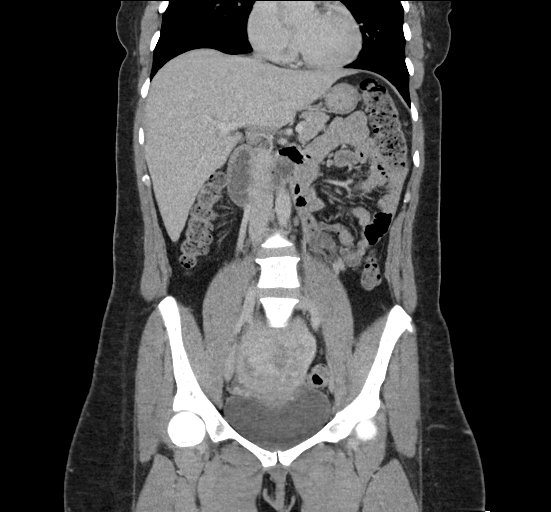
[im 61/109  soft-tissue]
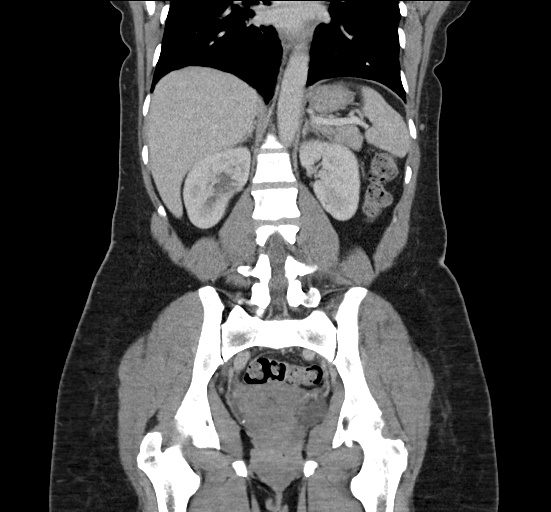

[16 of 46 positions shown; findings below may reference images not displayed]

FINDINGS: Lower chest: Unremarkable.

Hepatobiliary: No suspicious focal abnormality within the liver
parenchyma. There is no evidence for gallstones, gallbladder wall
thickening, or pericholecystic fluid. No intrahepatic or
extrahepatic biliary dilation.

Pancreas: No dilatation of the main duct. No intraparenchymal cyst.
No peripancreatic edema. 4 mm low-density focus identified in the
pancreatic tail ([DATE])

Spleen: No splenomegaly. No focal mass lesion.

Adrenals/Urinary Tract: No adrenal nodule or mass. Kidneys
unremarkable. No evidence for hydroureter. The urinary bladder
appears normal for the degree of distention.

Stomach/Bowel: Stomach is nondistended. Duodenum is normally
positioned as is the ligament of Treitz. No small bowel wall
thickening. No small bowel dilatation. The terminal ileum is normal.
Appendix is well seen on sagittal 50/7 and has normal appearance. No
gross colonic mass. No colonic wall thickening.

Vascular/Lymphatic: No abdominal aortic aneurysm. There is no
gastrohepatic or hepatoduodenal ligament lymphadenopathy. No
retroperitoneal or mesenteric lymphadenopathy. No pelvic sidewall
lymphadenopathy.

Reproductive: Uterus is markedly enlarged with multiple mass
lesions, compatible with fibroids. Uterus measures 16.5 x 7.8 x
cm. Endometrial stripe is substantially distorted by the apparent
fibroid disease. There is no adnexal mass.

Other: Trace free fluid noted in the cul-de-sac and right lower
quadrant.

Musculoskeletal: No worrisome lytic or sclerotic osseous
abnormality. Rounded lucency in the right femoral head has imaging
features suggestive of a benign chondroid lesion.
IMPRESSION: 1. Markedly enlarged fibroid uterus.
2. Trace free fluid in the cul-de-sac and right lower quadrant.
Finding is indeterminate but can be physiologic in a premenopausal
female.
3. 4 mm low-density focus in the tail of pancreas, likely a cyst.
Follow-up nonemergent pancreatic MR recommended to further evaluate.
Non-emergent MRI should be deferred until patient has been
discharged for the acute illness, and can optimally cooperate with
positioning and breath-holding instructions.
4. 4 mm low-density focus in the pancreatic tail. Follow-up CT in 12
months could be used to ensure stability. This recommendation
follows ACR consensus guidelines: Management of Incidental
Pancreatic Cysts: A White Paper of the ACR Incidental Findings
Committee. [HOSPITAL] [id number];[DATE].

## 2021-10-25 ENCOUNTER — Encounter: Payer: Self-pay | Admitting: Family Medicine

## 2021-10-25 ENCOUNTER — Ambulatory Visit: Payer: 59 | Admitting: Family Medicine

## 2021-10-25 VITALS — BP 160/100 | HR 62 | Temp 97.9°F | Ht 66.0 in | Wt 186.1 lb

## 2021-10-25 DIAGNOSIS — R030 Elevated blood-pressure reading, without diagnosis of hypertension: Secondary | ICD-10-CM

## 2021-10-25 DIAGNOSIS — M542 Cervicalgia: Secondary | ICD-10-CM

## 2021-10-25 LAB — POCT RAPID STREP A (OFFICE): Rapid Strep A Screen: NEGATIVE

## 2021-10-25 MED ORDER — VALACYCLOVIR HCL 1 G PO TABS: 1000.0000 mg | ORAL_TABLET | Freq: Three times a day (TID) | ORAL | 0 refills | Status: DC

## 2021-10-25 MED ORDER — GABAPENTIN 100 MG PO CAPS: 100.0000 mg | ORAL_CAPSULE | Freq: Three times a day (TID) | ORAL | 0 refills | Status: DC

## 2021-10-25 NOTE — Patient Instructions (Signed)
It was very nice to see you today!  Suspect shingles.  Take the valcyclovir no matter what.   Gabapentin 3x/day-can increast to '300mg'$  three x/day.  If need more pain meds, let me know.    PLEASE NOTE:  If you had any lab tests please let us know if you have not heard back within a few days. You may see your results on MyChart before we have a chance to review them but we will give you a call once they are reviewed by [place]. If we ordered any referrals today, please let us know if you have not heard from their office within the next week.   Please try these tips to maintain a healthy lifestyle:  Eat most of your calories during the day when you are active. Eliminate processed foods including packaged sweets (pies, cakes, cookies), reduce intake of potatoes, white bread, white pasta, and white rice. Look for whole grain options, oat flour or almond flour.  Each meal should contain half fruits/vegetables, one quarter protein, and one quarter carbs (no bigger than a computer mouse).  Cut down on sweet beverages. This includes juice, soda, and sweet tea. Also watch fruit intake, though this is a healthier sweet option, it still contains natural sugar! Limit to 3 servings daily.  Drink at least 1 glass of water with each meal and aim for at least 8 glasses per day  Exercise at least 150 minutes every week.

## 2021-10-25 NOTE — Progress Notes (Signed)
Subjective:     Patient ID: Tracey Taylor, female    DOB: 1970/11/06, 51 y.o.   MRN: [id number]  Chief Complaint  Patient presents with   Nausea    Started today   Headache    Right-sided headache that started Thursday   Neck Pain    Right-sided neck pain that started Thursday   Fatigue    HPI Pain R neck and HA.  Past few days since 9/14.  Taking sinus meds and sudafed-helps some but returns.  No f/c.  Some pain in throat.  Not to swallow.  No runny nose/congestion/cough. Some nausea today.    ?rash above R eyebrow-resolved. Took sudafed this am.  Took ibu few hrs ago.   Tylenol.   Had itchy bump on L leg-and used cortisone and resolved.    Health Maintenance Due  Topic Date Due   Hepatitis C Screening  Never done   COLONOSCOPY (Pts 45-60yr Insurance coverage will need to be confirmed)  Never done   MAMMOGRAM  07/30/2021    Past Medical History:  Diagnosis Date   Headache     Past Surgical History:  Procedure Laterality Date   CERVICAL BIOPSY  W/ LOOP ELECTRODE EXCISION N/A 02/06/2013   CYSTOSCOPY N/A 07/31/2019   Procedure: CYSTOSCOPY;  Surgeon: HBobbye Charleston MD;  [facility]  Service: Gynecology;  Laterality: N/A;   ECTOPIC PREGNANCY SURGERY N/A 1998   tube removed but does not remember which side   ROBOTIC ASSISTED LAPAROSCOPIC HYSTERECTOMY AND SALPINGECTOMY Bilateral 07/31/2019   Procedure: XI ROBOTIC ASSISTED LAPAROSCOPIC HYSTERECTOMY AND SALPINGECTOMY;  Surgeon: HBobbye Charleston MD;  [facility]  Service: Gynecology;  Laterality: Bilateral;   TUBAL LIGATION  2000    Outpatient Medications Prior to Visit  Medication Sig Dispense Refill   cetirizine (ZYRTEC) 10 MG tablet Take 10 mg by mouth daily.     aspirin-acetaminophen-caffeine (EXCEDRIN MIGRAINE) 250-250-65 MG tablet Take 2 tablets by mouth every 6 (six) hours as needed for headache.     oxyCODONE-acetaminophen (PERCOCET/ROXICET) 5-325 MG  tablet Take 1 tablet by mouth every 4 (four) hours as needed for severe pain. 30 tablet 0   No facility-administered medications prior to visit.    Allergies  Allergen Reactions   Gadolinium Derivatives Hives and Itching    Itching and one hive on right deltoid area.     ROS neg/noncontributory except as noted HPI/below      Objective:     BP (!) 160/100   Pulse 62   Temp 97.9 F (36.6 C) (Temporal)   Ht '5\' 6"'$  (1.676 m)   Wt 186 lb 2 oz (84.4 kg)   LMP 07/05/2019   SpO2 98%   BMI 30.04 kg/m  Wt Readings from Last 3 Encounters:  10/25/21 186 lb 2 oz (84.4 kg)  07/31/19 193 lb 1.6 oz (87.6 kg)  07/24/19 197 lb (89.4 kg)    Physical Exam   Gen: WDWN NAD HEENT: NCAT, conjunctiva not injected, sclera nonicteric. EOMI NECK:  supple, no thyromegaly,+ node R neck. , no carotid bruits TM WNL B, OP moist, no exudates  CARDIAC: RRR, S1S2+, no murmur. DP 2+B LUNGS: CTAB. No wheezes EXT:  no edema MSK: no gross abnormalities.  NEURO: A&O x3.  CN II-XII intact.  PSYCH: normal mood. Good eye contact Skin-few bumps-above R eyebrow, behind R ear/neck. Tiny  Results for orders placed or performed in visit on 10/25/21  POCT rapid strep A  Result Value Ref  Range   Rapid Strep A Screen Negative Negative       Assessment & Plan:   Problem List Items Addressed This Visit   None Visit Diagnoses     Neck pain    -  Primary   Relevant Orders   POCT rapid strep A (Completed)   Elevated blood pressure reading          Neck pain/HA-suspect shingles.  Could be other, but c/w shingles.  Valtrex '1000mg'$  tid x 10 d.  Gabapentin '100mg'$  tid.  Doesn't want strong narcs.  Poss tramadol.  Cont OTC ibu/tylenol.  Keep in touch Elevated blood pressure-suspect from sudafed/pain.  Monitor.   Meds ordered this encounter  Medications   valACYclovir (VALTREX) 1000 MG tablet    Sig: Take 1 tablet (1,000 mg total) by mouth 3 (three) times daily.    Dispense:  30 tablet    Refill:  0    gabapentin (NEURONTIN) 100 MG capsule    Sig: Take 1 capsule (100 mg total) by mouth 3 (three) times daily.    Dispense:  90 capsule    Refill:  0    Wellington Hampshire, MD

## 2021-10-28 LAB — HM MAMMOGRAPHY

## 2021-10-29 ENCOUNTER — Encounter: Payer: Self-pay | Admitting: Physician Assistant

## 2021-11-01 ENCOUNTER — Other Ambulatory Visit: Payer: Self-pay | Admitting: Obstetrics and Gynecology

## 2021-11-01 DIAGNOSIS — R928 Other abnormal and inconclusive findings on diagnostic imaging of breast: Secondary | ICD-10-CM

## 2021-11-10 ENCOUNTER — Ambulatory Visit
Admission: RE | Admit: 2021-11-10 | Discharge: 2021-11-10 | Disposition: A | Discharge status: Home / Self Care | Payer: 59 | Source: Ambulatory Visit | Attending: Obstetrics and Gynecology | Admitting: Obstetrics and Gynecology

## 2021-11-10 DIAGNOSIS — R928 Other abnormal and inconclusive findings on diagnostic imaging of breast: Secondary | ICD-10-CM

## 2021-11-25 ENCOUNTER — Encounter: Payer: Self-pay | Admitting: Physician Assistant

## 2021-12-14 ENCOUNTER — Encounter: Payer: Self-pay | Admitting: Physician Assistant

## 2022-01-05 LAB — HM COLONOSCOPY

## 2022-05-11 ENCOUNTER — Encounter: Payer: Self-pay | Admitting: Physician Assistant

## 2022-05-11 ENCOUNTER — Ambulatory Visit: Payer: 59 | Admitting: Physician Assistant

## 2022-05-11 VITALS — BP 126/80 | HR 75 | Temp 97.5°F | Ht 66.0 in | Wt 188.5 lb

## 2022-05-11 DIAGNOSIS — J040 Acute laryngitis: Secondary | ICD-10-CM | POA: Diagnosis not present

## 2022-05-11 LAB — POCT RAPID STREP A (OFFICE): Rapid Strep A Screen: NEGATIVE

## 2022-05-11 MED ORDER — PREDNISONE 20 MG PO TABS: 20.0000 mg | ORAL_TABLET | Freq: Every day | ORAL | 0 refills | Status: DC

## 2022-05-11 NOTE — Progress Notes (Signed)
Tracey Taylor is a 52 y.o. female here for a new problem.  History of Present Illness:   Chief Complaint  Patient presents with   Laryngitis    Pt c/o laryngitis started Sunday, no fever or chills.    HPI  Laryngitis Reports experiencing laryngitis starting 05/08/22.  Denies fever, chills, cough, ear pain, acid reflux, runny nose, itchy eyes, sore throat aside from isolated sore throat on the evening of 05/09/22 that resolved the next morning. Has been using heat therapy, honey, salt gargles, nasal spray without relief. Denies sick contact.  H/o seasonal allergies. Was outside playing basketball on 05/08/22 and hoarseness started immediately after that. Denies any yelling. Taking Allegra daily. Her job involves frequent talking.  Past Medical History:  Diagnosis Date   Headache      Social History   Tobacco Use   Smoking status: Never   Smokeless tobacco: Never  Vaping Use   Vaping Use: Never used  Substance Use Topics   Alcohol use: Yes    Alcohol/week: 1.0 standard drink of alcohol    Types: 1 Glasses of wine per week    Comment: socially   Drug use: No    Past Surgical History:  Procedure Laterality Date   CERVICAL BIOPSY  W/ LOOP ELECTRODE EXCISION N/A 02/06/2013   CYSTOSCOPY N/A 07/31/2019   Procedure: CYSTOSCOPY;  Surgeon: Bobbye Charleston, MD;  Location: [facility];  Service: Gynecology;  Laterality: N/A;   ECTOPIC PREGNANCY SURGERY N/A 1998   tube removed but does not remember which side   ROBOTIC ASSISTED LAPAROSCOPIC HYSTERECTOMY AND SALPINGECTOMY Bilateral 07/31/2019   Procedure: XI ROBOTIC ASSISTED LAPAROSCOPIC HYSTERECTOMY AND SALPINGECTOMY;  Surgeon: Bobbye Charleston, MD;  Location: [facility];  Service: Gynecology;  Laterality: Bilateral;   TUBAL LIGATION  2000    Family History  Problem Relation Age of Onset   Diabetes Mother    Hypertension Mother    Kidney disease Mother    Stroke Mother    Cancer  Father    Diabetes Maternal Aunt     Allergies  Allergen Reactions   Gadolinium Derivatives Hives and Itching    Itching and one hive on right deltoid area.      Current Medications:   Current Outpatient Medications:    cetirizine (ZYRTEC) 10 MG tablet, Take 10 mg by mouth daily., Disp: , Rfl:    estradiol (ESTRACE) 0.1 MG/GM vaginal cream, Place 1 Applicatorful vaginally., Disp: , Rfl:    predniSONE (DELTASONE) 20 MG tablet, Take 1 tablet (20 mg total) by mouth daily with breakfast., Disp: 10 tablet, Rfl: 0   Review of Systems:   Review of Systems  Constitutional:  Negative for chills, fever and malaise/fatigue.  HENT:  Negative for congestion, ear pain and sore throat.        (-) Rhinorrhea  Eyes:  Negative for blurred vision.  Respiratory:  Negative for cough and shortness of breath.   Cardiovascular:  Negative for chest pain, palpitations and leg swelling.  Gastrointestinal:  Negative for heartburn and vomiting.  Musculoskeletal:  Negative for back pain.  Skin:  Negative for itching (Eyes) and rash.  Neurological:  Negative for loss of consciousness and headaches.    Vitals:   Vitals:   05/11/22 1008  BP: 126/80  Pulse: 75  Temp: (!) 97.5 F (36.4 C)  TempSrc: Temporal  SpO2: 97%  Weight: 188 lb 8 oz (85.5 kg)  Height: 5\' 6"  (1.676 m)     Body mass index  is 30.42 kg/m.  Physical Exam:   Physical Exam Vitals and nursing note reviewed.  Constitutional:      General: She is not in acute distress.    Appearance: She is well-developed. She is not ill-appearing or toxic-appearing.  HENT:     Head: Normocephalic and atraumatic.     Right Ear: Tympanic membrane, ear canal and external ear normal. Tympanic membrane is not erythematous, retracted or bulging.     Left Ear: Tympanic membrane, ear canal and external ear normal. Tympanic membrane is not erythematous, retracted or bulging.     Nose: Nose normal.     Right Sinus: No maxillary sinus tenderness or  frontal sinus tenderness.     Left Sinus: No maxillary sinus tenderness or frontal sinus tenderness.     Mouth/Throat:     Pharynx: Uvula midline. Posterior oropharyngeal erythema present.     Tonsils: 0 on the right. 0 on the left.  Eyes:     General: Lids are normal.     Conjunctiva/sclera: Conjunctivae normal.  Neck:     Trachea: Trachea normal.  Cardiovascular:     Rate and Rhythm: Normal rate and regular rhythm.     Heart sounds: Normal heart sounds, S1 normal and S2 normal.  Pulmonary:     Effort: Pulmonary effort is normal.     Breath sounds: Normal breath sounds. No decreased breath sounds, wheezing, rhonchi or rales.  Lymphadenopathy:     Cervical: No cervical adenopathy.  Skin:    General: Skin is warm and dry.  Neurological:     Mental Status: She is alert.  Psychiatric:        Speech: Speech normal.        Behavior: Behavior normal. Behavior is cooperative.    Results for orders placed or performed in visit on 05/11/22  POCT rapid strep A  Result Value Ref Range   Rapid Strep A Screen Negative Negative    Assessment and Plan:   Laryngitis No red flags on exam.   Will initiate prednisone however did discuss that this may be of limited benefit. Work note provided. Discussed taking medications as prescribed. Reviewed return precautions including worsening fever, SOB, worsening cough or other concerns. Push fluids and rest. I recommend that patient follow-up if symptoms worsen or persist despite treatment x 7-10 days, sooner if needed.  If sx persist > 3 weeks, will need ENT referral - discussed with patient.   I,Alexander Ruley,acting as a Education administrator for [employer], PA.,have documented all relevant documentation on the behalf of Inda Coke, PA,as directed by  Inda Coke, PA while in the presence of Inda Coke, [place].   I, Inda Coke, [place], have reviewed all documentation for this visit. The documentation on 05/11/22 for the exam, diagnosis,  procedures, and orders are all accurate and complete.   Inda Coke, PA-C

## 2022-05-11 NOTE — Patient Instructions (Signed)
It was great to see you!  Take prednisone for throat/vocal cord inflammation Vocal rest is key!  Work-note today  If symptoms persist > 3 weeks or if new symptoms develop, please reach out!  Take care,  Inda Coke PA-C

## 2022-09-22 ENCOUNTER — Encounter (INDEPENDENT_AMBULATORY_CARE_PROVIDER_SITE_OTHER): Payer: Self-pay

## 2022-10-12 NOTE — Progress Notes (Signed)
Subjective:    Tracey Taylor is a 52 y.o. female and is here for a comprehensive physical exam.  HPI  Health Maintenance Due  Topic Date Due   Hepatitis C Screening  Never done    Acute Concerns: None  Chronic Issues: Allergies Stable with cetirizine 10 mg daily.  Headaches Reports she has headaches when putting a hat on or applying other pressure to her head. This issue has been present for years. Pain is diffuse in her head, concentrating in the occipital area and neck. Denies other neck pain. Denies vision changes, numbness/tingling in her face. Pain resolves after releasing pressure on head. She works at [employer] and believes she doesn't always have good posture.  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  Denies abdominal pain, unintentional weight loss.  Denies skin concerns, ear concerns, pain with eye movements, leg swelling, GI symptoms. Denies recent Covid-19 infection.  Health Maintenance: Immunizations -- UTD on tetanus vaccine. Declines flu, shingles vaccines. Colonoscopy --  Done on 01/05/22 with a ten year recall. Mammogram -- Normal on 10/28/21. 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  PAP -- Negative on 05/03/19. Due for repeat. Bone Density -- N/A Diet -- Healthy Exercise -- Regular  Sleep habits -- Mostly stable but has trouble falling asleep occasionally. Mood -- Stable  UTD with dentist? - Yes UTD with eye doctor? - Yes  Weight history: Wt Readings from Last 10 Encounters:  10/19/22 185 lb 8 oz (84.1 kg)  05/11/22 188 lb 8 oz (85.5 kg)  10/25/21 186 lb 2 oz (84.4 kg)  07/31/19 193 lb 1.6 oz (87.6 kg)  07/24/19 197 lb (89.4 kg)  04/04/19 195 lb 8 oz (88.7 kg)  03/15/19 190 lb (86.2 kg)  03/07/17 197 lb (89.4 kg)  03/21/16 197 lb 3.2 oz (89.4 kg)  03/16/16 194 lb 4 oz (88.1 kg)   Body mass index is 29.94 kg/m. Patient's  last menstrual period was 07/05/2019.  Alcohol use:  reports current alcohol use of about 1.0 standard drink of alcohol per week.  Tobacco use:  Tobacco Use: Low Risk  (05/11/2022)   Patient History    Smoking Tobacco Use: Never    Smokeless Tobacco Use: Never    Passive Exposure: Not on file   Eligible for lung cancer screening? No     10/19/2022    9:41 AM  Depression screen PHQ 2/9  Decreased Interest 0  Down, Depressed, Hopeless 0  PHQ - 2 Score 0  Altered sleeping 1  Tired, decreased energy 0  Change in appetite 0  Feeling bad or failure about yourself  0  Trouble concentrating 0  Moving slowly or fidgety/restless 0  Suicidal thoughts 0  PHQ-9 Score 1  Difficult doing work/chores Not difficult at all     Other providers/specialists: Patient Care Team: Jarold Motto, [place] as PCP - General (Physician Assistant)    PMHx, SurgHx, SocialHx, Medications, and Allergies were reviewed in the Visit Navigator and updated as appropriate.   Past Medical History:  Diagnosis Date   Headache      Past Surgical History:  Procedure Laterality Date   CERVICAL BIOPSY  W/ LOOP ELECTRODE EXCISION N/A 02/06/2013   CYSTOSCOPY N/A 07/31/2019   Procedure: CYSTOSCOPY;  Surgeon: Carrington Clamp, MD;  Location: [facility];  Service: Gynecology;  Laterality: N/A;   ECTOPIC PREGNANCY SURGERY N/A 1998   tube removed but  does not remember which side   ROBOTIC ASSISTED LAPAROSCOPIC HYSTERECTOMY AND SALPINGECTOMY Bilateral 07/31/2019   Procedure: XI ROBOTIC ASSISTED LAPAROSCOPIC HYSTERECTOMY AND SALPINGECTOMY;  Surgeon: Carrington Clamp, MD;  Location: [facility];  Service: Gynecology;  Laterality: Bilateral;   TUBAL LIGATION  2000     Family History  Problem Relation Age of Onset   Diabetes Mother    Hypertension Mother    Kidney disease Mother    Stroke Mother    Cancer Father        ?liver; in remission   Diabetes Maternal Aunt     Social  History   Tobacco Use   Smoking status: Never   Smokeless tobacco: Never  Vaping Use   Vaping status: Never Used  Substance Use Topics   Alcohol use: Yes    Alcohol/week: 1.0 standard drink of alcohol    Types: 1 Glasses of wine per week    Comment: socially   Drug use: No    Review of Systems:   Review of Systems  Constitutional:  Positive for diaphoresis (Night). Negative for chills, fever, malaise/fatigue and weight loss.  HENT:  Negative for ear discharge, ear pain, hearing loss, sinus pain and sore throat.   Eyes:  Negative for blurred vision and double vision.  Respiratory:  Negative for cough, hemoptysis and shortness of breath.   Cardiovascular:  Negative for chest pain, palpitations, leg swelling and PND.  Gastrointestinal:  Negative for abdominal pain, constipation, diarrhea, heartburn, nausea and vomiting.  Genitourinary:  Negative for dysuria, frequency and urgency.  Musculoskeletal:  Negative for back pain, myalgias and neck pain.  Skin:  Negative for itching and rash.       (-) New or concerning skin growths  Neurological:  Negative for dizziness, tingling, seizures and headaches.  Endo/Heme/Allergies:  Negative for polydipsia.  Psychiatric/Behavioral:  Negative for depression. The patient is not nervous/anxious.     Objective:   BP 130/86 (BP Location: Left Arm, Patient Position: Sitting, Cuff Size: Large)   Pulse 83   Temp 97.8 F (36.6 C) (Temporal)   Ht 5\' 6"  (1.676 m)   Wt 185 lb 8 oz (84.1 kg)   LMP 07/05/2019   SpO2 98%   BMI 29.94 kg/m  Body mass index is 29.94 kg/m.   General Appearance:    Alert, cooperative, no distress, appears stated age  Head:    Normocephalic, without obvious abnormality, atraumatic  Eyes:    PERRL, conjunctiva/corneas clear, EOM's intact, fundi    benign, both eyes  Ears:    Normal TM's and external ear canals, both ears  Nose:   Nares normal, septum midline, mucosa normal, no drainage    or sinus tenderness   Throat:   Lips, mucosa, and tongue normal; teeth and gums normal  Neck:   Supple, symmetrical, trachea midline, no adenopathy;    thyroid:  no enlargement/tenderness/nodules; no carotid   bruit or JVD  Back:     Symmetric, no curvature, ROM normal, no CVA tenderness  Lungs:     Clear to auscultation bilaterally, respirations unlabored  Chest Wall:    No tenderness or deformity   Heart:    Regular rate and rhythm, S1 and S2 normal, no murmur, rub or gallop  Breast Exam:    Deferred  Abdomen:     Soft, non-tender, bowel sounds active all four quadrants,    no masses, no organomegaly  Genitalia:    Deferred  Extremities:   Extremities normal, atraumatic, no cyanosis  or edema  Pulses:   2+ and symmetric all extremities  Skin:   Skin color, texture, turgor normal, no rashes or lesions  Lymph nodes:   Cervical, supraclavicular, and axillary nodes normal  Neurologic:   CNII-XII intact, normal strength, sensation and reflexes    throughout    Assessment/Plan:   Routine physical examination Today patient counseled on age appropriate routine health concerns for screening and prevention, each reviewed and up to date or declined. Immunizations reviewed and up to date or declined. Labs ordered and reviewed. Risk factors for depression reviewed and negative. Hearing function and visual acuity are intact. ADLs screened and addressed as needed. Functional ability and level of safety reviewed and appropriate. Education, counseling and referrals performed based on assessed risks today. Patient provided with a copy of personalized plan for preventive services.  Pancreatic cyst Reviewed CT results of 2021; discussed need for MR to follow-up on cyst She declined any further imaging at this time  Encounter for screening for other viral diseases Update hepatitis C virus  Overweight Continue efforts at healthy lifestyle  Other headache syndrome Unclear etiology Possibly muscular vs occipital neuralgia  vs other Offered imaging vs neurology referral but she declined Continue to monitor, low threshold to obtain imaging or refer to neuro   I,Alexander Ruley,acting as a scribe for [employer], PA.,have documented all relevant documentation on the behalf of Jarold Motto, PA,as directed by  Jarold Motto, PA while in the presence of Jarold Motto, [place].   I, Jarold Motto, [place], have reviewed all documentation for this visit. The documentation on 10/19/22 for the exam, diagnosis, procedures, and orders are all accurate and complete.   Jarold Motto, PA-C [employer] [facility]

## 2022-10-19 ENCOUNTER — Encounter: Payer: Self-pay | Admitting: Physician Assistant

## 2022-10-19 ENCOUNTER — Ambulatory Visit (INDEPENDENT_AMBULATORY_CARE_PROVIDER_SITE_OTHER): Payer: 59 | Admitting: Physician Assistant

## 2022-10-19 VITALS — BP 130/86 | HR 83 | Temp 97.8°F | Ht 66.0 in | Wt 185.5 lb

## 2022-10-19 DIAGNOSIS — Z0000 Encounter for general adult medical examination without abnormal findings: Secondary | ICD-10-CM

## 2022-10-19 DIAGNOSIS — Z1159 Encounter for screening for other viral diseases: Secondary | ICD-10-CM

## 2022-10-19 DIAGNOSIS — K862 Cyst of pancreas: Secondary | ICD-10-CM | POA: Diagnosis not present

## 2022-10-19 DIAGNOSIS — E663 Overweight: Secondary | ICD-10-CM | POA: Diagnosis not present

## 2022-10-19 DIAGNOSIS — G4489 Other headache syndrome: Secondary | ICD-10-CM

## 2022-10-19 LAB — CBC WITH DIFFERENTIAL/PLATELET
Basophils Absolute: 0 10*3/uL (ref 0.0–0.1)
Basophils Relative: 0.5 % (ref 0.0–3.0)
Eosinophils Absolute: 0.1 10*3/uL (ref 0.0–0.7)
Eosinophils Relative: 1.5 % (ref 0.0–5.0)
HCT: 42.9 % (ref 36.0–46.0)
Hemoglobin: 14.7 g/dL (ref 12.0–15.0)
Lymphocytes Relative: 46 % (ref 12.0–46.0)
Lymphs Abs: 2.2 10*3/uL (ref 0.7–4.0)
MCHC: 34.3 g/dL (ref 30.0–36.0)
MCV: 83.1 fl (ref 78.0–100.0)
Monocytes Absolute: 0.4 10*3/uL (ref 0.1–1.0)
Monocytes Relative: 8.2 % (ref 3.0–12.0)
Neutro Abs: 2.1 10*3/uL (ref 1.4–7.7)
Neutrophils Relative %: 43.8 % (ref 43.0–77.0)
Platelets: 153 10*3/uL (ref 150.0–400.0)
RBC: 5.16 Mil/uL — ABNORMAL HIGH (ref 3.87–5.11)
RDW: 14 % (ref 11.5–15.5)
WBC: 4.9 10*3/uL (ref 4.0–10.5)

## 2022-10-19 LAB — COMPREHENSIVE METABOLIC PANEL
ALT: 15 U/L (ref 0–35)
AST: 14 U/L (ref 0–37)
Albumin: 3.8 g/dL (ref 3.5–5.2)
Alkaline Phosphatase: 50 U/L (ref 39–117)
BUN: 15 mg/dL (ref 6–23)
CO2: 25 meq/L (ref 19–32)
Calcium: 8.5 mg/dL (ref 8.4–10.5)
Chloride: 106 meq/L (ref 96–112)
Creatinine, Ser: 0.83 mg/dL (ref 0.40–1.20)
GFR: 82.29 mL/min (ref >60.00)
Glucose, Bld: 82 mg/dL (ref 70–99)
Potassium: 4.4 meq/L (ref 3.5–5.1)
Sodium: 137 meq/L (ref 135–145)
Total Bilirubin: 0.7 mg/dL (ref 0.2–1.2)
Total Protein: 6.7 g/dL (ref 6.0–8.3)

## 2022-10-19 LAB — LIPID PANEL
Cholesterol: 224 mg/dL — ABNORMAL HIGH (ref 0–200)
HDL: 74.6 mg/dL (ref >39.00)
LDL Cholesterol: 131 mg/dL — ABNORMAL HIGH (ref 0–99)
NonHDL: 149.16
Total CHOL/HDL Ratio: 3
Triglycerides: 89 mg/dL (ref 0.0–149.0)
VLDL: 17.8 mg/dL (ref 0.0–40.0)

## 2022-10-19 NOTE — Patient Instructions (Signed)
It was great to see you! ? ?Please go to the lab for blood work.  ? ?Our office will call you with your results unless you have chosen to receive results via MyChart. ? ?If your blood work is normal we will follow-up each year for physicals and as scheduled for chronic medical problems. ? ?If anything is abnormal we will treat accordingly and get you in for a follow-up. ? ?Take care, ? ?Samantha ?  ? ? ?

## 2022-10-20 LAB — HEPATITIS C ANTIBODY: Hepatitis C Ab: NONREACTIVE

## 2022-11-08 LAB — HM MAMMOGRAPHY

## 2023-06-05 ENCOUNTER — Ambulatory Visit: Payer: Self-pay

## 2023-06-05 NOTE — Telephone Encounter (Signed)
  Chief Complaint: neck pain Symptoms: neck pain, nausea, lightheaded Frequency: x 3 days Pertinent Negatives: Patient denies stiffness Disposition: [] ED /[] Urgent Care (no appt availability in office) / [x] Appointment(In office/virtual)/ []  [facility]/ [] Home Care/ [] Refused Recommended Disposition /[] [facility]/ []  Follow-up with PCP Additional Notes: pt states neck pain for a while that comes and goes and that this weekend it got worse and then she felt lightheaded and dizziness. States felt like she need air. States taken ibuprofen  and dramamine and ginger chews for nausea and lightheadedness.   Copied from CRM [id number]. Topic: Clinical - Red Word Triage >> Jun 05, 2023  4:20 PM Abigail D wrote: Red Word that prompted transfer to Nurse Triage: Dizziness, nausea, back of neck pain. Reason for Disposition  [1] MODERATE neck pain (e.g., interferes with normal activities) AND [2] present > 3 days  Answer Assessment - Initial Assessment Questions 1. ONSET: "When did the pain begin?"      This weekend 2. LOCATION: "Where does it hurt?"      Back of neck 3. PATTERN "Does the pain come and go, or has it been constant since it started?"      Comes and goes 4. SEVERITY: "How bad is the pain?"  (Scale 1-10; or mild, moderate, severe)   - NO PAIN (0): no pain or only slight stiffness    - MILD (1-3): doesn't interfere with normal activities    - MODERATE (4-7): interferes with normal activities or awakens from sleep    - SEVERE (8-10):  excruciating pain, unable to do any normal activities      5/10 5. RADIATION: "Does the pain go anywhere else, shoot into your arms?"     Down back 6. CORD SYMPTOMS: "Any weakness or numbness of the arms or legs?"    no 7. CAUSE: "What do you think is causing the neck pain?"     unknown 8. NECK OVERUSE: "Any recent activities that involved turning or twisting the neck?"     no 9. OTHER SYMPTOMS: "Do you have any other symptoms?"  (e.g., headache, fever, chest pain, difficulty breathing, neck swelling)     nausea  Protocols used: Neck Pain or Stiffness-A-AH

## 2023-06-06 NOTE — Telephone Encounter (Signed)
 Noted.

## 2023-06-08 ENCOUNTER — Encounter: Payer: Self-pay | Admitting: Physician Assistant

## 2023-06-08 ENCOUNTER — Ambulatory Visit

## 2023-06-08 ENCOUNTER — Ambulatory Visit: Admitting: Physician Assistant

## 2023-06-08 VITALS — BP 141/90 | HR 77 | Temp 97.3°F | Ht 66.0 in | Wt 198.5 lb

## 2023-06-08 DIAGNOSIS — M542 Cervicalgia: Secondary | ICD-10-CM | POA: Diagnosis not present

## 2023-06-08 DIAGNOSIS — R42 Dizziness and giddiness: Secondary | ICD-10-CM | POA: Diagnosis not present

## 2023-06-08 DIAGNOSIS — R030 Elevated blood-pressure reading, without diagnosis of hypertension: Secondary | ICD-10-CM

## 2023-06-08 DIAGNOSIS — R350 Frequency of micturition: Secondary | ICD-10-CM

## 2023-06-08 DIAGNOSIS — R079 Chest pain, unspecified: Secondary | ICD-10-CM

## 2023-06-08 DIAGNOSIS — K862 Cyst of pancreas: Secondary | ICD-10-CM | POA: Diagnosis not present

## 2023-06-08 DIAGNOSIS — R1900 Intra-abdominal and pelvic swelling, mass and lump, unspecified site: Secondary | ICD-10-CM

## 2023-06-08 LAB — POCT URINALYSIS DIPSTICK
Bilirubin, UA: NEGATIVE
Blood, UA: NEGATIVE
Glucose, UA: NEGATIVE
Ketones, UA: NEGATIVE
Leukocytes, UA: NEGATIVE
Nitrite, UA: NEGATIVE
Protein, UA: NEGATIVE
Spec Grav, UA: <=1.005 — AB (ref 1.010–1.025)
Urobilinogen, UA: 0.2 U/dL
pH, UA: 6.5 (ref 5.0–8.0)

## 2023-06-08 LAB — CBC WITH DIFFERENTIAL/PLATELET
Basophils Absolute: 0 10*3/uL (ref 0.0–0.1)
Basophils Relative: 0.6 % (ref 0.0–3.0)
Eosinophils Absolute: 0.1 10*3/uL (ref 0.0–0.7)
Eosinophils Relative: 1.8 % (ref 0.0–5.0)
HCT: 43.3 % (ref 36.0–46.0)
Hemoglobin: 14.9 g/dL (ref 12.0–15.0)
Lymphocytes Relative: 44.9 % (ref 12.0–46.0)
Lymphs Abs: 2 10*3/uL (ref 0.7–4.0)
MCHC: 34.5 g/dL (ref 30.0–36.0)
MCV: 83.9 fl (ref 78.0–100.0)
Monocytes Absolute: 0.3 10*3/uL (ref 0.1–1.0)
Monocytes Relative: 6.8 % (ref 3.0–12.0)
Neutro Abs: 2 10*3/uL (ref 1.4–7.7)
Neutrophils Relative %: 45.9 % (ref 43.0–77.0)
Platelets: 177 10*3/uL (ref 150.0–400.0)
RBC: 5.16 Mil/uL — ABNORMAL HIGH (ref 3.87–5.11)
RDW: 12.8 % (ref 11.5–15.5)
WBC: 4.4 10*3/uL (ref 4.0–10.5)

## 2023-06-08 LAB — COMPREHENSIVE METABOLIC PANEL WITH GFR
ALT: 23 U/L (ref 0–35)
AST: 20 U/L (ref 0–37)
Albumin: 4.1 g/dL (ref 3.5–5.2)
Alkaline Phosphatase: 52 U/L (ref 39–117)
BUN: 13 mg/dL (ref 6–23)
CO2: 30 meq/L (ref 19–32)
Calcium: 8.8 mg/dL (ref 8.4–10.5)
Chloride: 105 meq/L (ref 96–112)
Creatinine, Ser: 0.76 mg/dL (ref 0.40–1.20)
GFR: 91.06 mL/min (ref >60.00)
Glucose, Bld: 81 mg/dL (ref 70–99)
Potassium: 4 meq/L (ref 3.5–5.1)
Sodium: 140 meq/L (ref 135–145)
Total Bilirubin: 0.4 mg/dL (ref 0.2–1.2)
Total Protein: 6.9 g/dL (ref 6.0–8.3)

## 2023-06-08 LAB — LIPASE: Lipase: 31 U/L (ref 11.0–59.0)

## 2023-06-08 LAB — IBC + FERRITIN
Ferritin: 86.9 ng/mL (ref 10.0–291.0)
Iron: 116 ug/dL (ref 42–145)
Saturation Ratios: 35.1 % (ref 20.0–50.0)
TIBC: 330.4 ug/dL (ref 250.0–450.0)
Transferrin: 236 mg/dL (ref 212.0–360.0)

## 2023-06-08 LAB — TSH: TSH: 2.01 u[IU]/mL (ref 0.35–5.50)

## 2023-06-08 MED ORDER — MELOXICAM 15 MG PO TABS: ORAL_TABLET | ORAL | 0 refills | Status: DC

## 2023-06-08 NOTE — Patient Instructions (Addendum)
 It was great to see you!  Your EKG was reassuring We are going to get a chest xray, neck xray We are going to get updated imaging of your abdominal pancreatic cyst  We are going to refer you to sports medicine for your neck pain  If any worsening in the meantime, please go to the ER  Take care,  Ruhani Umland PA-C

## 2023-06-08 NOTE — Progress Notes (Signed)
 Tracey Taylor is a 53 y.o. female here for a new problem.  History of Present Illness:   Chief Complaint  Patient presents with   Neck Pain    Pt c/o neck pain base of neck off and on for awhile.   Dizziness    Pt c/o feeling lightheaded and nausea x 3 days.   Urinary Frequency    Pt c/o urinary frequency, urgency and low mid back pain.   Neck Pain Patient complains today of intermittent neck pain.  Pain tends to initiate at the base of her neck and radiates up her lower head.  Pressing on the site tends to worsen the sensation.   She saw our office for this on Sep 2023 and then again in Sep 2024 -- she was recommended to have imaging vs neurology referral but she declined at the time. Denies numbness/tingling in hands  Urinary Frequency  Patient complains of urinary frequency and urgency.  Associated symptoms include upper and mid back pain. She does however report drinking more water as she has been feeling more dehydrated.  Patient reports that laying on her back worsens her symptoms. Pain tends to then radiate to her chest.  She typically tends to workout regularly but has not this past week due to her symptoms.   Lightheadedness She also complains of lightheadedness accompanied by nausea  Symptoms have persisted since this past Friday, however, symptoms have been improving since then.  Typically has adequate diet and denies over using any NSAID's. Endorse's occasional heart palpitations at rest.  Patient denies at recent colds or viral sickness.   Elevated blood pressure  Currently taking no medication. At home blood pressure readings are: not checked. Patient denies blurred vision, dizziness, unusual headaches, lower leg swelling. Denies excessive caffeine intake, stimulant usage, excessive alcohol intake, or increase in salt consumption. She is experiencing a little chest tightness in the center of her chest but no radiation of symptom(s).  BP Readings from Last  3 Encounters:  06/08/23 (!) 141/90  10/19/22 130/86  05/11/22 126/80   Pancreatic cyst She was recommended to have follow up pancreatic MR to further evaluate the incidental finding in 2021 She has not wanted to do this but is willing now  Past Medical History:  Diagnosis Date   Headache      Social History   Tobacco Use   Smoking status: Never   Smokeless tobacco: Never  Vaping Use   Vaping status: Never Used  Substance Use Topics   Alcohol use: Yes    Alcohol/week: 1.0 standard drink of alcohol    Types: 1 Glasses of wine per week    Comment: socially   Drug use: No    Past Surgical History:  Procedure Laterality Date   CERVICAL BIOPSY  W/ LOOP ELECTRODE EXCISION N/A 02/06/2013   CYSTOSCOPY N/A 07/31/2019   Procedure: CYSTOSCOPY;  Surgeon: Matt Song, MD;  Location: [facility];  Service: Gynecology;  Laterality: N/A;   ECTOPIC PREGNANCY SURGERY N/A 1998   tube removed but does not remember which side   ROBOTIC ASSISTED LAPAROSCOPIC HYSTERECTOMY AND SALPINGECTOMY Bilateral 07/31/2019   Procedure: XI ROBOTIC ASSISTED LAPAROSCOPIC HYSTERECTOMY AND SALPINGECTOMY;  Surgeon: Matt Song, MD;  Location: [facility];  Service: Gynecology;  Laterality: Bilateral;   TUBAL LIGATION  2000    Family History  Problem Relation Age of Onset   Diabetes Mother    Hypertension Mother    Kidney disease Mother    Stroke Mother  Cancer Father        ?liver; in remission   Diabetes Maternal Aunt     Allergies  Allergen Reactions   Gadolinium Derivatives Hives and Itching    Itching and one hive on right deltoid area.      Current Medications:   Current Outpatient Medications:    cetirizine (ZYRTEC) 10 MG tablet, Take 10 mg by mouth daily., Disp: , Rfl:    cyanocobalamin (VITAMIN B12) 1000 MCG tablet, Take 1,000 mcg by mouth daily., Disp: , Rfl:    estradiol (ESTRACE) 0.1 MG/GM vaginal cream, Place 1 Applicatorful vaginally., Disp:  , Rfl:    ibuprofen  (ADVIL ) 200 MG tablet, Take 400 mg by mouth every 6 (six) hours as needed., Disp: , Rfl:    meloxicam  (MOBIC ) 15 MG tablet, Take daily with food x 1 week; then as needed thereafter., Disp: 30 tablet, Rfl: 0   Vitamin D, Cholecalciferol, 25 MCG (1000 UT) CAPS, Take 1 capsule by mouth daily in the afternoon., Disp: , Rfl:    Review of Systems:   Review of Systems  Cardiovascular:  Positive for chest pain and palpitations.  Gastrointestinal:  Positive for nausea.  Genitourinary:  Positive for frequency and urgency.  Musculoskeletal:  Positive for back pain and neck pain.  Neurological:  Positive for dizziness.  Negative unless otherwise specified per HPI.  Vitals:   Vitals:   06/08/23 0817  BP: (!) 141/90  Pulse: 77  Temp: (!) 97.3 F (36.3 C)  TempSrc: Temporal  SpO2: 97%  Weight: 198 lb 8 oz (90 kg)  Height: 5\' 6"  (1.676 m)     Body mass index is 32.04 kg/m.  Physical Exam:   Physical Exam Vitals and nursing note reviewed.  Constitutional:      General: She is not in acute distress.    Appearance: Normal appearance. She is well-developed. She is not ill-appearing or toxic-appearing.  HENT:     Head: Normocephalic and atraumatic.  Eyes:     General: Lids are normal.     Extraocular Movements: Extraocular movements intact.     Conjunctiva/sclera: Conjunctivae normal.  Neck:     Comments: Tenderness to palpation to base of skull Normal range of motion of neck Cervical paraspinal tenderness to palpation  Cardiovascular:     Rate and Rhythm: Normal rate and regular rhythm.     Pulses: Normal pulses.     Heart sounds: Normal heart sounds, S1 normal and S2 normal.  Pulmonary:     Effort: Pulmonary effort is normal.     Breath sounds: Normal breath sounds.  Musculoskeletal:        General: Normal range of motion.     Cervical back: Normal range of motion and neck supple.  Skin:    General: Skin is warm and dry.  Neurological:     Mental Status:  She is alert and oriented to person, place, and time.     GCS: GCS eye subscore is 4. GCS verbal subscore is 5. GCS motor subscore is 6.  Psychiatric:        Attention and Perception: Attention and perception normal.        Mood and Affect: Mood normal. Affect is tearful.        Speech: Speech normal.        Behavior: Behavior normal. Behavior is cooperative.        Thought Content: Thought content normal.        Judgment: Judgment normal.    Results for  orders placed or performed in visit on 06/08/23  POCT urinalysis dipstick  Result Value Ref Range   Color, UA Yellow    Clarity, UA Clear    Glucose, UA Negative Negative   Bilirubin, UA Negative    Ketones, UA Negative    Spec Grav, UA <=1.005 (A) 1.010 - 1.025   Blood, UA Negative    pH, UA 6.5 5.0 - 8.0   Protein, UA Negative Negative   Urobilinogen, UA 0.2 0.2 or 1.0 E.U./dL   Nitrite, UA Negative    Leukocytes, UA Negative Negative   Appearance     Odor      Assessment and Plan:   Neck pain Acute on chronic issue Will obtain neck xray Refer to sports medicine  Trial mobic  15 mg daily x 1 week with food; then as needed thereafter Consider neurology evaluation or advanced imaging if indicated in the future If worsening symptom(s), recommend emergency room   Urinary frequency Urinalysis normal Will send for urine culture If new/worsening symptom(s), she will reach out Update blood work today  Chest pain, unspecified type EKG tracing is personally reviewed.  EKG notes NSR.  No acute changes.  Chest xray normal Consider anti-reflux medications to help with any gastroesophageal reflux disease  Update blood work and provide recommendations Consider cardiology referral If any worsening symptom(s), will need to go to the ER  Pancreatic cyst; Intra-abdominal and pelvic swelling, mass and lump, unspecified site Will order follow up imaging on this -- she is agreeable today  Lightheadedness Unclear etiology Update  blood work, push fluids, work on consistent nutrition throughout the day and stress management If symptom(s) worsen, needs to go to the ER  Elevated blood pressure reading Above goal today No evidence of end-organ damage on my exam Recommend patient monitor home blood pressure at least a few times weekly If home monitoring shows consistent elevation, or any symptom(s) develop, recommend reach out to us  for further advice on next steps   Alexander Iba, PA-C  I,Safa M Kadhim,acting as a scribe for [employer], PA.,have documented all relevant documentation on the behalf of Alexander Iba, PA,as directed by  Alexander Iba, PA while in the presence of Alexander Iba, [place].   I, Alexander Iba, [place], have reviewed all documentation for this visit. The documentation on 06/08/23 for the exam, diagnosis, procedures, and orders are all accurate and complete.   I spent a total of 43 minutes on this visit, today 06/08/23, which included reviewing previous notes, ordering tests, discussing plan of care with patient and using shared-decision making on next steps, refilling medications, and documenting the findings in the note.

## 2023-06-09 LAB — URINE CULTURE
MICRO NUMBER:: 16400939
SPECIMEN QUALITY:: ADEQUATE

## 2023-06-11 ENCOUNTER — Encounter: Payer: Self-pay | Admitting: Physician Assistant

## 2023-06-12 ENCOUNTER — Encounter: Payer: Self-pay | Admitting: Physician Assistant

## 2023-06-14 ENCOUNTER — Ambulatory Visit: Admitting: Family Medicine

## 2023-06-14 ENCOUNTER — Encounter: Payer: Self-pay | Admitting: Physician Assistant

## 2023-06-14 ENCOUNTER — Other Ambulatory Visit: Payer: Self-pay | Admitting: Physician Assistant

## 2023-06-14 ENCOUNTER — Encounter: Payer: Self-pay | Admitting: Family Medicine

## 2023-06-14 VITALS — BP 124/86 | HR 73 | Ht 66.0 in | Wt 199.0 lb

## 2023-06-14 DIAGNOSIS — M542 Cervicalgia: Secondary | ICD-10-CM | POA: Diagnosis not present

## 2023-06-14 DIAGNOSIS — R110 Nausea: Secondary | ICD-10-CM | POA: Diagnosis not present

## 2023-06-14 DIAGNOSIS — R0789 Other chest pain: Secondary | ICD-10-CM

## 2023-06-14 DIAGNOSIS — R079 Chest pain, unspecified: Secondary | ICD-10-CM

## 2023-06-14 MED ORDER — TIZANIDINE HCL 4 MG PO TABS: 4.0000 mg | ORAL_TABLET | Freq: Four times a day (QID) | ORAL | 1 refills | Status: DC | PRN

## 2023-06-14 NOTE — Progress Notes (Signed)
 I, Miquel Amen, CMA acting as a scribe for Garlan Juniper, MD.  Tracey Taylor is a 53 y.o. female who presents to [facility] at [facility] today for neck pain x several years, worsening over the past couple of weeks. Pt locates pain to bilateral neck. Pain radiating into the arms, shoulder, and periscapular regional. Denies n/t/w. Denies injury. No relief with Meloxicam . Sx causing night disturbance. Sx affecting ability to work, does computer work.   Radiates: arms and upper back UE Numbness/tingling: no UE Weakness: no Aggravates: ROM Treatments tried: meloxicam   Dx imaging: 06/08/23 C-spine XR  Pertinent review of systems: No fevers or chills.  She does note some chest pressure and some nausea.  Relevant historical information: History of migraine headaches.   Exam:  BP (!) 124/96   Pulse 73   Ht 5\' 6"  (1.676 m)   Wt 199 lb (90.3 kg)   LMP 07/05/2019   SpO2 98%   BMI 32.12 kg/m  General: Well Developed, well nourished, and in no acute distress.   MSK: C-spine: Normal appearing Nontender palpation spinal midline. Tender palpation left cervical paraspinal musculature. Decreased cervical motion. Upper extremity strength is intact.    Lab and Radiology Results  DG Cervical Spine Complete Result Date: 06/08/2023 CLINICAL DATA:  Neck pain EXAM: CERVICAL SPINE - COMPLETE 4+ VIEW COMPARISON:  None Available. FINDINGS: On the lateral radiograph, C1-C7 are visualized. Mild straightening of the normal cervical lordosis. Vertebral body heights are well maintained without acute fracture. The lateral masses of C1 are well aligned with C2. The visualized odontoid is intact. Intervertebral disc heights are preserved. No significant neural foraminal stenosis. No prevertebral soft tissue swelling. No abnormal translation of the cervical spine with flexion. IMPRESSION: Mild straightening of the normal cervical lordosis, which may be due to patient positioning or  muscular spasm. Otherwise, no acute fracture or malalignment of the cervical spine. Electronically Signed   By: Rance Burrows M.D.   On: 06/08/2023 09:31   DG Chest 2 View Result Date: 06/08/2023 CLINICAL DATA:  chest tightness EXAM: CHEST - 2 VIEW COMPARISON:  March 16, 2019 FINDINGS: No focal airspace consolidation, pleural effusion, or pneumothorax. No cardiomegaly. No acute fracture or destructive lesion. IMPRESSION: No acute cardiopulmonary abnormality. Electronically Signed   By: Rance Burrows M.D.   On: 06/08/2023 09:28   I, Garlan Juniper, personally (independently) visualized and performed the interpretation of the images attached in this note.  Twelve-lead EKG performed at PCP office Jun 08, 2023 normal.    Recent Results (from the past 2160 hours)  POCT urinalysis dipstick     Status: Abnormal   Collection Time: 06/08/23  8:30 AM  Result Value Ref Range   Color, UA Yellow    Clarity, UA Clear    Glucose, UA Negative Negative   Bilirubin, UA Negative    Ketones, UA Negative    Spec Grav, UA <=1.005 (A) 1.010 - 1.025   Blood, UA Negative    pH, UA 6.5 5.0 - 8.0   Protein, UA Negative Negative   Urobilinogen, UA 0.2 0.2 or 1.0 E.U./dL   Nitrite, UA Negative    Leukocytes, UA Negative Negative   Appearance     Odor    CBC with Differential/Platelet     Status: Abnormal   Collection Time: 06/08/23  9:24 AM  Result Value Ref Range   WBC 4.4 4.0 - 10.5 K/uL   RBC 5.16 (H) 3.87 - 5.11 Mil/uL   Hemoglobin 14.9 12.0 -  15.0 g/dL   HCT 54.0 98.1 - 19.1 %   MCV 83.9 78.0 - 100.0 fl   MCHC 34.5 30.0 - 36.0 g/dL   RDW 47.8 29.5 - 62.1 %   Platelets 177.0 150.0 - 400.0 K/uL   Neutrophils Relative % 45.9 43.0 - 77.0 %   Lymphocytes Relative 44.9 12.0 - 46.0 %   Monocytes Relative 6.8 3.0 - 12.0 %   Eosinophils Relative 1.8 0.0 - 5.0 %   Basophils Relative 0.6 0.0 - 3.0 %   Neutro Abs 2.0 1.4 - 7.7 K/uL   Lymphs Abs 2.0 0.7 - 4.0 K/uL   Monocytes Absolute 0.3 0.1 - 1.0 K/uL    Eosinophils Absolute 0.1 0.0 - 0.7 K/uL   Basophils Absolute 0.0 0.0 - 0.1 K/uL  Comprehensive metabolic panel with GFR     Status: None   Collection Time: 06/08/23  9:24 AM  Result Value Ref Range   Sodium 140 135 - 145 mEq/L   Potassium 4.0 3.5 - 5.1 mEq/L   Chloride 105 96 - 112 mEq/L   CO2 30 19 - 32 mEq/L   Glucose, Bld 81 70 - 99 mg/dL   BUN 13 6 - 23 mg/dL   Creatinine, Ser 3.08 0.40 - 1.20 mg/dL   Total Bilirubin 0.4 0.2 - 1.2 mg/dL   Alkaline Phosphatase 52 39 - 117 U/L   AST 20 0 - 37 U/L   ALT 23 0 - 35 U/L   Total Protein 6.9 6.0 - 8.3 g/dL   Albumin 4.1 3.5 - 5.2 g/dL   GFR 65.78 >46.96 mL/min    Comment: Calculated using the CKD-EPI Creatinine Equation (2021)   Calcium 8.8 8.4 - 10.5 mg/dL  TSH     Status: None   Collection Time: 06/08/23  9:24 AM  Result Value Ref Range   TSH 2.01 0.35 - 5.50 uIU/mL  IBC + Ferritin     Status: None   Collection Time: 06/08/23  9:24 AM  Result Value Ref Range   Iron 116 42 - 145 ug/dL   Transferrin 295.2 841.3 - 360.0 mg/dL   Saturation Ratios 24.4 20.0 - 50.0 %   Ferritin 86.9 10.0 - 291.0 ng/mL   TIBC 330.4 250.0 - 450.0 mcg/dL  Lipase     Status: None   Collection Time: 06/08/23  9:24 AM  Result Value Ref Range   Lipase 31.0 11.0 - 59.0 U/L  Urine Culture     Status: None   Collection Time: 06/08/23  9:24 AM   Specimen: Blood  Result Value Ref Range   MICRO NUMBER: [id number]    SPECIMEN QUALITY: Adequate    Sample Source URINE    STATUS: FINAL    Result:      Mixed genital flora isolated. These superficial bacteria are not indicative of a urinary tract infection. No further organism identification is warranted on this specimen. If clinically indicated, recollect clean-catch, mid-stream urine and transfer  immediately to Urine Culture Transport Tube.       Assessment and Plan: 53 y.o. female with left-sided neck pain secondary to muscle spasm and dysfunction.  Her PCP has done an excellent job with initial workup  and evaluation.  Plan for heating pad tizanidine  and physical therapy.  PT could be quite helpful especially dry needling.  Additionally she notes some chest pressure and nausea.  I have communicated with her PCP.  She has ordered an abdominal MRI to follow-up pancreas cyst seen on prior to imaging and is  referred to cardiology to evaluate chest pressure more thoroughly.  Check back and see me in 1 month.  PDMP not reviewed this encounter. Orders Placed This Encounter  Procedures   Ambulatory referral to Physical Therapy    Referral Priority:   Routine    Referral Type:   Physical Medicine    Referral Reason:   Specialty Services Required    Requested Specialty:   Physical Therapy    Number of Visits Requested:   1   Meds ordered this encounter  Medications   tiZANidine  (ZANAFLEX ) 4 MG tablet    Sig: Take 1 tablet (4 mg total) by mouth every 6 (six) hours as needed for muscle spasms.    Dispense:  30 tablet    Refill:  1     Discussed warning signs or symptoms. Please see discharge instructions. Patient expresses understanding.   The above documentation has been reviewed and is accurate and complete Garlan Juniper, M.D.

## 2023-06-14 NOTE — Patient Instructions (Signed)
 Thank you for coming in today.   Rx sent in for muscle relaxer, Tizanidine .   A referral for physical therapy has been submitted. A representative from the physical therapy office will contact you to coordinate scheduling after confirming your benefits with your insurance provider. If you do not hear from the physical therapy office within the next 1-2 weeks, please let us  know.   See you back in 1 month

## 2023-06-15 ENCOUNTER — Other Ambulatory Visit: Payer: Self-pay | Admitting: Physician Assistant

## 2023-06-15 DIAGNOSIS — G4489 Other headache syndrome: Secondary | ICD-10-CM

## 2023-06-15 DIAGNOSIS — R42 Dizziness and giddiness: Secondary | ICD-10-CM

## 2023-06-20 ENCOUNTER — Telehealth: Payer: Self-pay | Admitting: *Deleted

## 2023-06-20 NOTE — Telephone Encounter (Signed)
 Copied from CRM [id number]. Topic: Referral - Question >> Jun 20, 2023  2:38 PM Caliyah H wrote: Reason for CRM: Patient called stating she sent a MyChart message this morning regarding scheduling an appointment with Neurology.She was informed the earliest available appointment is July 24. Patient is requesting a referral to a different location due to ongoing symptoms.

## 2023-06-20 NOTE — Telephone Encounter (Signed)
 Spoke to pt told her I can not send a referral to another Neurologist in [place] because you are already established with a provider and they will not see you. Only other option is referral to [facility] but that could take months. Pt verbalized understanding. Told pt I suggest you contact Neurology and have them put you on the cancellation list. Pt said she already did that. Told her if your symptoms get worse call and let them know maybe they can get you in sooner. Pt verbalized understanding.

## 2023-06-22 ENCOUNTER — Encounter: Payer: Self-pay | Admitting: Physician Assistant

## 2023-06-27 ENCOUNTER — Ambulatory Visit: Admitting: Physical Therapy

## 2023-06-27 ENCOUNTER — Encounter: Payer: Self-pay | Admitting: Physical Therapy

## 2023-06-27 DIAGNOSIS — M542 Cervicalgia: Secondary | ICD-10-CM

## 2023-06-27 DIAGNOSIS — M6281 Muscle weakness (generalized): Secondary | ICD-10-CM | POA: Diagnosis not present

## 2023-06-27 NOTE — Therapy (Signed)
 OUTPATIENT PHYSICAL THERAPY CERVICAL EVALUATION   Patient Name: Tracey Taylor MRN: [id number] DOB:Aug 20, 1970, 53 y.o., female Today's Date: 06/27/2023  END OF SESSION:  PT End of Session - 06/27/23 0847     Visit Number 1    Number of Visits 16    Date for PT Re-Evaluation 09/19/23    Authorization Type [employer]    PT Start Time 0848    PT Stop Time 0928    PT Time Calculation (min) 40 min    Activity Tolerance Patient limited by pain    Behavior During Therapy [facility] for tasks assessed/performed             Past Medical History:  Diagnosis Date   Headache    Past Surgical History:  Procedure Laterality Date   CERVICAL BIOPSY  W/ LOOP ELECTRODE EXCISION N/A 02/06/2013   CYSTOSCOPY N/A 07/31/2019   Procedure: CYSTOSCOPY;  Surgeon: Matt Song, MD;  Location: [facility];  Service: Gynecology;  Laterality: N/A;   ECTOPIC PREGNANCY SURGERY N/A 1998   tube removed but does not remember which side   ROBOTIC ASSISTED LAPAROSCOPIC HYSTERECTOMY AND SALPINGECTOMY Bilateral 07/31/2019   Procedure: XI ROBOTIC ASSISTED LAPAROSCOPIC HYSTERECTOMY AND SALPINGECTOMY;  Surgeon: Matt Song, MD;  Location: [facility];  Service: Gynecology;  Laterality: Bilateral;   TUBAL LIGATION  2000   Patient Active Problem List   Diagnosis Date Noted   Intramural leiomyoma of uterus 04/04/2019   Pancreatic cyst 04/04/2019   Anemia 04/04/2019   Mixed stress and urge urinary incontinence 04/04/2019   Pain in left knee 07/25/2018   Tension headache 03/21/2016   Migraine variant, intractable 03/21/2016   Vertigo 03/21/2016    PCP: Alexander Iba, PA  REFERRING PROVIDER: Syliva Even, MD  REFERRING DIAG: M54.2 (ICD-10-CM) - Cervicalgia   THERAPY DIAG:  Cervicalgia  Muscle weakness (generalized)  Rationale for Evaluation and Treatment: Rehabilitation  ONSET DATE: years, 3 weeks ago last episode  SUBJECTIVE:                                                                                                                                                                                                          SUBJECTIVE STATEMENT:  States she has had neck pain for years. States it has gotten more intense in the middle a few weeks ago. States that three weeks ago she started having some dizziness as well.  States that her shoulders also bother her. States that her cross body bag also aggravates her. States she was having some pressure in her  chest if she lays on her back or sitting up in certain positions.    Works form home and is sitting all day. Got new mattress, and neck pillow. States she is more comfortable in her new chair at work.  States she has not been able to wear a tight ponytail abrades for a while now because of pain she also has noticed some jaw symptoms and pressure in her ears.  Reports she still has some bouts of dizziness but does not report any current numbness or tingling in her hands.  Reports both sides are equally affected.  She was working out pretty regularly doing body weight stuff about 2 months ago but has not been able to do it since her symptoms increased.  Her muscle relaxer does help but she does not like to take medication and she has an initial consult with a neurologist on July 24.   Hand dominance: Right  PERTINENT HISTORY:  recent dizziness  PAIN:  Are you having pain? Yes: NPRS scale: 2/10 Pain location: center of neck  Pain description: intense, achy Aggravating factors: sitting for long periods of time, braids.   Relieving factors: medication, laying on side  PRECAUTIONS: None  RED FLAGS: None     WEIGHT BEARING RESTRICTIONS: No  FALLS:  Has patient fallen in last 6 months? No    OCCUPATION: collections, works from home, moderate stress  PLOF: Independent  PATIENT GOALS: to have less pain   NEXT MD VISIT: neuro July 24th  OBJECTIVE:  Note: Objective measures were completed at  Evaluation unless otherwise noted.  DIAGNOSTIC FINDINGS:  06/08/23 FINDINGS: On the lateral radiograph, C1-C7 are visualized. Mild straightening of the normal cervical lordosis. Vertebral body heights are well maintained without acute fracture. The lateral masses of C1 are well aligned with C2. The visualized odontoid is intact. Intervertebral disc heights are preserved. No significant neural foraminal stenosis. No prevertebral soft tissue swelling. No abnormal translation of the cervical spine with flexion.   IMPRESSION: Mild straightening of the normal cervical lordosis, which may be due to patient positioning or muscular spasm. Otherwise, no acute fracture or malalignment of the cervical spine.  PATIENT SURVEYS:  Patient-specific activity functional scoring scheme (Point to one number):  "0" represents "unable to perform." "10" represents "able to perform at prior level. 0 1 2 3 4 5 6 7 8 9  10 (Date and Score) Activity Initial  Activity Eval     Wearing braids  0    Wearing cross body bag 5    Working out 0    Additional Additional Total score = sum of the activity scores/number of activities Minimum detectable change (90%CI) for average score = 2 points Minimum detectable change (90%CI) for single activity score = 3 points PSFS developed by: [place] Spitz., & Binkley, J. (1995). Assessing disability and change on individual  patients: a report of a patient specific measure. Physiotherapy [place], [phone], [phone]. Reproduced with the permission of the authors  Score: 5/3= 1.66   COGNITION: Overall cognitive status: Within functional limits for tasks assessed  SENSATION: Not tested  POSTURE: rounded shoulders, forward head, and flexed trunk   PALPATION: Increased resting tone and pain with palpation along B cervical paraspinals, UT, pecs, suboccipitals, unable to assess joint mobility due to pain   CERVICAL ROM:   Active ROM A/PROM (deg) eval   Flexion WFL  Extension WFL*  Right lateral flexion   Left lateral flexion   Right rotation 75  Left  rotation 65*   (Blank rows = not tested)    UE Measurements Upper Extremity Right EVAL Left EVAL   A/PROM MMT A/PROM MMT  Shoulder Flexion WFL 4 WFL 4  Shoulder Extension      Shoulder Abduction WFL 4  4-*  Shoulder Adduction      Shoulder Internal Rotation WFL 4+ WFL 4+  Shoulder External Rotation WFL 3+ WFL 3+  Elbow Flexion      Elbow Extension      Wrist Flexion      Wrist Extension      Wrist Supination      Wrist Pronation      Wrist Ulnar Deviation      Wrist Radial Deviation      Grip Strength NA  NA     (Blank rows = not tested)   * pain   TREATMENT DATE:                                                                                                                               06/27/2023  Therapeutic Exercise:  Aerobic: Supine: Prone:  Seated:  Standing: Neuromuscular Re-education:self massage for desensitization, body scan, ergonomic set up.  Manual Therapy: STM to temporalis, cervical paraspinals and pec minor R  Therapeutic Activity: Self Care: Trigger Point Dry Needling:  Modalities:     PATIENT EDUCATION:  Education details: on current presentation, on HEP, on clinical outcomes score and POC, on anatomy, muscle tension, on ergonomic set up Person educated: Patient Education method: Programmer, multimedia, Facilities manager, and Handouts Education comprehension: verbalized understanding   HOME EXERCISE PROGRAM: No medbridge at this time,  Body scan, self massage to neck/head/chest muscles  ASSESSMENT:  CLINICAL IMPRESSION: Patient presents to physical therapy with complaints of neck and shoulder pain that started a while ago but recently increased in intensity of the last 3 weeks.  Patient presents with cervicogenic headache symptoms as well as increased tension throughout cervical musculature.  Educated patient on current finding as well as benefits of  physical therapy.  Patient with significant muscle guarding and pain with light palpation.  Encourage patient to perform gentle self massage to help down regulate central nervous system.  Patient would greatly benefit from skilled PT to address physical impairments and return her to optimal function.  OBJECTIVE IMPAIRMENTS: decreased activity tolerance, decreased mobility, decreased ROM, decreased strength, improper body mechanics, postural dysfunction, and pain.   ACTIVITY LIMITATIONS: carrying, sitting, sleeping, reach over head, and hygiene/grooming  PARTICIPATION LIMITATIONS: community activity and occupation  PERSONAL FACTORS: Fitness, Time since onset of injury/illness/exacerbation, and 1 comorbidity: dizziness are also affecting patient's functional outcome.   REHAB POTENTIAL: Good  CLINICAL DECISION MAKING: Stable/uncomplicated  EVALUATION COMPLEXITY: Low   GOALS: Goals reviewed with patient? yes  SHORT TERM GOALS: Target date: 08/08/2023   Patient will be independent in self management strategies to improve quality of life and functional outcomes. Baseline: New Program Goal status: INITIAL  2.  Patient  will report at least 50% improvement in overall symptoms and/or function to demonstrate improved functional mobility Baseline: 0% better Goal status: INITIAL  3.  Patient will be able to regularly wear cross body back without increase in symptoms Baseline: Painful Goal status: INITIAL    LONG TERM GOALS: Target date: 09/19/2023    Patient will report at least 75% improvement in overall symptoms and/or function to demonstrate improved functional mobility Baseline: 0% better Goal status: INITIAL  2.  Patient will score at least 2 points higher on PSFS average to demonstrate change in overall function. Baseline: see above Goal status: INITIAL  3.  Patient will be able to regularly workout without increase in symptoms returning to prior level of function Baseline:  Unable Goal status: INITIAL     PLAN:  PT FREQUENCY: 1-2x/week for a total to 16 visits over 12 week certification period  PT DURATION: 12 weeks  PLANNED INTERVENTIONS: 97110-Therapeutic exercises, 97530- Therapeutic activity, 97112- Neuromuscular re-education, 97535- Self Care, 91478- Manual therapy, [phone]- Gait training, [phone]- Orthotic Fit/training, [phone]- Canalith repositioning, [id number]- Aquatic Therapy, 97014- Electrical stimulation (unattended), [phone]- Ionotophoresis 4mg /ml Dexamethasone , Patient/Family education, Balance training, Stair training, Taping, Dry Needling, Joint mobilization, Joint manipulation, Spinal manipulation, Spinal mobilization, Cryotherapy, and Moist heat   PLAN FOR NEXT SESSION: STM, DN if indicated/tolerated, f/u body scan and self massage, Ergonomic set up, thoracic mobility (towel roll, chin tucks/neck ROM, serratus +, breathing exercises   10:21 AM, 06/27/23 Tabitha Ewings, DPT Physical Therapy with [facility]

## 2023-07-04 ENCOUNTER — Ambulatory Visit (INDEPENDENT_AMBULATORY_CARE_PROVIDER_SITE_OTHER): Admitting: Neurology

## 2023-07-04 VITALS — BP 135/96 | HR 87 | Ht 67.0 in | Wt 200.0 lb

## 2023-07-04 DIAGNOSIS — R42 Dizziness and giddiness: Secondary | ICD-10-CM

## 2023-07-04 DIAGNOSIS — M542 Cervicalgia: Secondary | ICD-10-CM

## 2023-07-04 NOTE — Progress Notes (Signed)
 [facility] [address] [place]. [place] [address] [phone]       OFFICE CONSULT NOTE  [name] Date of Birth:  Nov 26, 1970 Medical Record Number:  [id number]   Referring MD: Alexander Iba, PA  Reason for Referral: Lightheadedness  HPI: Tracey Taylor is a 53 year old pleasant African-American lady seen today for initial office consultation visit.  History is obtained from the patient and review of electronic medical records and I personally reviewed pertinent available imaging films in PACS.  Patient has no significant past medical history except chronic neck pain.  She states for the last year or 2 she has had some posterior neck pain which she is describes mostly as muscle tightness and stiffness.  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  Over the last month or 2 she is had episodes of nausea and lightheadedness.  She got up the elevator once and felt as if she was still moving after taking a few steps.  She was able to catch herself and did not fall.  She denies any true vertigo, loss of vision, double vision, extremity weakness numbness gait or balance problems.  She feels occasionally she is about to fall and needs to sit down.  She does admit to occasional skipping heartbeat but denies palpitations fainting episodes.  She feels a couple of times she felt she was about to pass out but was able to sit down and that feeling passed.  She does complain of posterior neck pain and muscle tightness and her primary care physician recently put her on Zanaflex  which seems to be working.  She had some x-rays of neck and EKG fine but has not had any brain imaging was done.  She denies any prior history of strokes TIA seizures migraines or significant neurological problems. She had orthostatic vitals checked in office and complained of lightheadedness but did not have significant drop in blood pressure ROS:   14  system review of systems is positive for neck pain, muscle tightness, lightheadedness, dizziness, nausea, and palpitations all other systems negative  PMH:  Past Medical History:  Diagnosis Date   Headache     Social History:  Social History   Socioeconomic History   Marital status: Divorced    Spouse name: Not on file   Number of children: Not on file   Years of education: Not on file   Highest education level: Not on file  Occupational History   Not on file  Tobacco Use   Smoking status: Never   Smokeless tobacco: Never  Vaping Use   Vaping status: Never Used  Substance and Sexual Activity   Alcohol use: Yes    Alcohol/week: 1.0 standard drink of alcohol    Types: 1 Glasses of wine per week    Comment: socially   Drug use: No   Sexual activity: Yes    Partners: Male    Birth control/protection: None, Surgical  Other Topics Concern   Not on file  Social History Narrative   Not on file   Social Drivers of Health   Financial Resource Strain: Not on file  Food Insecurity: Not on file  Transportation Needs: Not on file  Physical Activity: Not on file  Stress: Not on file  Social Connections: Unknown (06/22/2021)   Received from [facility], [facility]   Social Network    Social Network: Not on file  Intimate Partner Violence: Unknown (05/13/2021)  Received from [facility], [facility]   HITS    Physically Hurt: Not on file    Insult or Talk Down To: Not on file    Threaten Physical Harm: Not on file    Scream or Curse: Not on file    Medications:   Current Outpatient Medications on File Prior to Visit  Medication Sig Dispense Refill   cetirizine (ZYRTEC) 10 MG tablet Take 10 mg by mouth daily.     cyanocobalamin (VITAMIN B12) 1000 MCG tablet Take 1,000 mcg by mouth daily.     ibuprofen  (ADVIL ) 200 MG tablet Take 400 mg by mouth every 6 (six) hours as needed.     meclizine  (ANTIVERT ) 25 MG tablet Take 25 mg by mouth 3 (three) times daily as needed.      meloxicam  (MOBIC ) 15 MG tablet Take daily with food x 1 week; then as needed thereafter. 30 tablet 0   tiZANidine  (ZANAFLEX ) 4 MG tablet Take 1 tablet (4 mg total) by mouth every 6 (six) hours as needed for muscle spasms. 30 tablet 1   Vitamin D, Cholecalciferol, 25 MCG (1000 UT) CAPS Take 1 capsule by mouth daily in the afternoon.     No current facility-administered medications on file prior to visit.    Allergies:   Allergies  Allergen Reactions   Gadolinium Derivatives Hives and Itching    Itching and one hive on right deltoid area.      Physical Exam General: well developed, well nourished, seated, in no evident distress Head: head normocephalic and atraumatic.   Neck: supple with no carotid or supraclavicular bruits Cardiovascular: regular rate and rhythm, no murmurs Musculoskeletal: no deformity.  Mild tightness of posterior neck muscles.  No restriction of movements. Skin:  no rash/petichiae Vascular:  Normal pulses all extremities    07/04/2023    8:32 AM 06/14/2023    9:42 AM 06/14/2023    8:59 AM  Vitals with BMI  Height 5\' 7"   5\' 6"   Weight 200 lbs  199 lbs  BMI 31.32  32.13  Systolic 135 124 161  Diastolic 96 86 96  Pulse 87  73    Neurologic Exam Mental Status: Awake and fully alert. Oriented to place and time. Recent and remote memory intact. Attention span, concentration and fund of knowledge appropriate. Mood and affect appropriate.  Cranial Nerves: Fundoscopic exam reveals sharp disc margins. Pupils equal, briskly reactive to light. Extraocular movements full without nystagmus. Visual fields full to confrontation. Hearing intact. Facial sensation intact. Face, tongue, palate moves normally and symmetrically.  Motor: Normal bulk and tone. Normal strength in all tested extremity muscles. Sensory.: intact to touch , pinprick , position and vibratory sensation.  Coordination: Rapid alternating movements normal in all extremities. Finger-to-nose and heel-to-shin  performed accurately bilaterally. Gait and Station: Arises from chair without difficulty. Stance is normal. Gait demonstrates normal stride length and balance . Able to heel, toe and tandem walk with slight difficulty.  Reflexes: 1+ and symmetric. Toes downgoing.      ASSESSMENT: 53 year old African-American lady with symptoms of subjective lightheadedness and dizziness of unclear etiology which need further workup.  Chronic posterior neck pain likely musculoskeletal in nature.     PLAN:I had a long discussion with the patient regarding his symptoms of dizziness lightheadedness as well as neck pain and discussed my evaluation and treatment plan and answered questions.  I recommend further evaluation by checking MRI scan of the brain, MRA of the brain and neck and 30-day heart  monitor.  I recommend she do regular neck stretching exercises and continue to use Zanaflex  as needed for musculoskeletal neck pain.  I also encouraged her to increase participation in regular activities for stress relaxation like exercise, meditation and yoga and to improve her back posture while sitting to help with her posterior neck pain.  She will return for follow-up in the future in 6 months or call earlier if necessary.  Greater than 50% time during this 50-minute consultation visit was spent in counseling and coordination of care about her complaints of lightheadedness and neck pain and answering questions.  Ardella Beaver, MD Note: This document was prepared with digital dictation and possible smart phrase technology. Any transcriptional errors that result from this process are unintentional.

## 2023-07-04 NOTE — Patient Instructions (Signed)
 I had a long discussion with the patient regarding his symptoms of dizziness lightheadedness as well as neck pain and discussed my evaluation and treatment plan and answered questions.  I recommend further evaluation by checking MRI scan of the brain, MRA of the brain and neck and 30-day heart monitor.  I recommend she do regular neck stretching exercises and continue to use Zanaflex  as needed for musculoskeletal neck pain.  I also encouraged her to increase participation in regular activities for stress relaxation like exercise, meditation and yoga and to improve her back posture while sitting to help with her posterior neck pain.  She will return for follow-up in the future in 6 months or call earlier if necessary.  Neck Exercises Ask your health care provider which exercises are safe for you. Do exercises exactly as told by your health care provider and adjust them as directed. It is normal to feel mild stretching, pulling, tightness, or discomfort as you do these exercises. Stop right away if you feel sudden pain or your pain gets worse. Do not begin these exercises until told by your health care provider. Neck exercises can be important for many reasons. They can improve strength and maintain flexibility in your neck, which will help your upper back and prevent neck pain. Stretching exercises Rotation neck stretching  Sit in a chair or stand up. Place your feet flat on the floor, shoulder-width apart. Slowly turn your head (rotate) to the right until a slight stretch is felt. Turn it all the way to the right so you can look over your right shoulder. Do not tilt or tip your head. Hold this position for 10-30 seconds. Slowly turn your head (rotate) to the left until a slight stretch is felt. Turn it all the way to the left so you can look over your left shoulder. Do not tilt or tip your head. Hold this position for 10-30 seconds. Repeat __________ times. Complete this exercise __________ times a  day. Neck retraction  Sit in a sturdy chair or stand up. Look straight ahead. Do not bend your neck. Use your fingers to push your chin backward (retraction). Do not bend your neck for this movement. Continue to face straight ahead. If you are doing the exercise properly, you will feel a slight sensation in your throat and a stretch at the back of your neck. Hold the stretch for 1-2 seconds. Repeat __________ times. Complete this exercise __________ times a day. Strengthening exercises Neck press  Lie on your back on a firm bed or on the floor with a pillow under your head. Use your neck muscles to push your head down on the pillow and straighten your spine. Hold the position as well as you can. Keep your head facing up (in a neutral position) and your chin tucked. Slowly count to 5 while holding this position. Repeat __________ times. Complete this exercise __________ times a day. Isometrics These are exercises in which you strengthen the muscles in your neck while keeping your neck still (isometrics). Sit in a supportive chair and place your hand on your forehead. Keep your head and face facing straight ahead. Do not flex or extend your neck while doing isometrics. Push forward with your head and neck while pushing back with your hand. Hold for 10 seconds. Do the sequence again, this time putting your hand against the back of your head. Use your head and neck to push backward against the hand pressure. Finally, do the same exercise on either side  of your head, pushing sideways against the pressure of your hand. Repeat __________ times. Complete this exercise __________ times a day. Prone head lifts  Lie face-down (prone position), resting on your elbows so that your chest and upper back are raised. Start with your head facing downward, near your chest. Position your chin either on or near your chest. Slowly lift your head upward. Lift until you are looking straight ahead. Then continue  lifting your head as far back as you can comfortably stretch. Hold your head up for 5 seconds. Then slowly lower it to your starting position. Repeat __________ times. Complete this exercise __________ times a day. Supine head lifts  Lie on your back (supine position), bending your knees to point to the ceiling and keeping your feet flat on the floor. Lift your head slowly off the floor, raising your chin toward your chest. Hold for 5 seconds. Repeat __________ times. Complete this exercise __________ times a day. Scapular retraction  Stand with your arms at your sides. Look straight ahead. Slowly pull both shoulders (scapulae) backward and downward (retraction) until you feel a stretch between your shoulder blades in your upper back. Hold for 10-30 seconds. Relax and repeat. Repeat __________ times. Complete this exercise __________ times a day. Contact a health care provider if: Your neck pain or discomfort gets worse when you do an exercise. Your neck pain or discomfort does not improve within 2 hours after you exercise. If you have any of these problems, stop exercising right away. Do not do the exercises again unless your health care provider says that you can. Get help right away if: You develop sudden, severe neck pain. If this happens, stop exercising right away. Do not do the exercises again unless your health care provider says that you can. This information is not intended to replace advice given to you by your health care provider. Make sure you discuss any questions you have with your health care provider. Document Revised: 07/21/2020 Document Reviewed: 07/21/2020 Elsevier Patient Education  2024 [employer].

## 2023-07-05 ENCOUNTER — Telehealth: Payer: Self-pay | Admitting: Neurology

## 2023-07-05 NOTE — Telephone Encounter (Signed)
 Called to schedule scans that Dr. Janett Medin ordered. Patient stated that she breaks out in hives when she has contrast before and she would like to know if she could take medication to help with this. Her best call back number is [phone]. She is scheduled for her scans with contrast for 07/12/23 at 1pm

## 2023-07-06 MED ORDER — PREDNISONE 50 MG PO TABS: ORAL_TABLET | ORAL | 0 refills | Status: DC

## 2023-07-06 NOTE — Addendum Note (Signed)
 Addended by: Elton Ham on: 07/06/2023 09:09 AM   Modules accepted: Orders

## 2023-07-06 NOTE — Telephone Encounter (Signed)
 Called the patient and advised there is a protocol in place for her to take prior to imaging with contrast. Reviewed what this looks like and advised how she would take it. She is agreeable to this.  Will send a script for 50 mg tablet of prednisone  x3 tab for the pt to take 1 tab 12 hrs prior to procedure, 1 tab 7 hrs prior to procedure and then additional 1 tablet 1 hr prior to procedure with a 50mg  Bendaryl tablet.  Pt verbalized understanding. Pt had no questions at this time but was encouraged to call back if questions arise.

## 2023-07-10 ENCOUNTER — Encounter: Admitting: Physical Therapy

## 2023-07-11 ENCOUNTER — Ambulatory Visit (INDEPENDENT_AMBULATORY_CARE_PROVIDER_SITE_OTHER)

## 2023-07-11 DIAGNOSIS — R42 Dizziness and giddiness: Secondary | ICD-10-CM

## 2023-07-12 ENCOUNTER — Ambulatory Visit (INDEPENDENT_AMBULATORY_CARE_PROVIDER_SITE_OTHER)

## 2023-07-12 DIAGNOSIS — T50905A Adverse effect of unspecified drugs, medicaments and biological substances, initial encounter: Secondary | ICD-10-CM | POA: Diagnosis not present

## 2023-07-12 DIAGNOSIS — R42 Dizziness and giddiness: Secondary | ICD-10-CM

## 2023-07-12 MED ORDER — GADOBENATE DIMEGLUMINE 529 MG/ML IV SOLN
20.0000 mL | Freq: Once | INTRAVENOUS | Status: AC | PRN
  Administered 2023-07-12: 20 mL via INTRAVENOUS

## 2023-07-12 NOTE — Progress Notes (Unsigned)
 Dear Dr Janett Medin,     Your patient, Mrs. Tracey Taylor arrived today for a second day of imaging through our MRI -The type of scan performed today was an MRI of the brain with and without contrast and an MRA of the neck with and without contrast.   After more than 15 minutes post completion of the imaging study, the patient reported swelling of her lips tingling discomfort , chest tightness - She was placed in an arm chair and her VS were stable.  unfortunately her IV access had at the time already been removed.   She was able to swallow ,she was able to speak clearly, she was oriented and cooperative,  in normal state of mind and cognitively intact.  She was able to walk.   I was called as the working doctor and  gave the patient 40 mg of Solu-Medrol through a newly established IV access, these were diluted in 10 mL of normal saline , as well as 50 mg of diphenhydramine diluted by 5 ml NaCl 0.9% and flushed with another 5 mL normal saline.   The patient did feel a burning sensation from the Benadryl.   Solu-Medrol was used and a 40 mg vial with the lot number [id number] expiration date 02- 2026 , serial number [id number].   Diphenhydramine was used as 50 mg/mL concentration in a 1 mL vial produced by [employer],  Lot #[id number], expiration date 3/ 2026   She was under observation here in the office for another 30 minutes before being released home in the presence of her husband.   The patient also received a EpiPen from our stack cabinet to take home in case any respiratory distress should return.    Sincerely, Asiah Befort MD  [facility] Alexander Iba, PA and PCP

## 2023-07-12 NOTE — Patient Instructions (Signed)
 Epinephrine Auto-Injector What is this medication? EPINEPHRINE (ep i NEF rin) treats severe allergic reactions (anaphylaxis). It may also be used to treat sudden asthma attacks. It reduces the effects of an allergic reaction, such as trouble breathing or swelling of the face, lips, and throat. Call emergency services after injection. You may need additional treatment. This medicine may be used for other purposes; ask your health care provider or pharmacist if you have questions. COMMON BRAND NAME(S): Adrenaclick, Adrenalin, Auvi-Q, Epinephrine Professional EMS, Epinephrine Professional with Safety Seal, epinephrinesnap, epinephrinesnap-v, EpiPen, Epipen Jr, EPIsnap Epinephrine, SYMJEPI, Twinject What should I tell my care team before I take this medication? They need to know if you have any of the following conditions: Diabetes (high blood sugar) Glaucoma Heart disease High blood pressure Kidney disease Parkinson disease Pheochromocytoma Thyroid  disease An unusual or allergic reaction to epinephrine, other medications, foods, dyes, or preservatives Pregnant or trying to get pregnant Breast-feeding How should I use this medication? This medication is injected into a muscle or under the skin. You will be taught how to prepare and give it. Take it as directed on the prescription label. Do not use it more often than directed. It is important that you put your used needles and syringes in a special sharps container. Do not put them in a trash can. If you do not have a sharps container, call your pharmacist or care team to get one. This medication comes with INSTRUCTIONS FOR USE. Ask your pharmacist for directions on how to use this medication. Read the information carefully. Talk to your pharmacist or care team if you have questions. Talk to your care team about the use of this medication in children. While it may be prescribed for selected conditions, precautions do apply. Overdosage: If you think  you have taken too much of this medicine contact a poison control center or emergency room at once. NOTE: This medicine is only for you. Do not share this medicine with others. What if I miss a dose? This does not apply. This medication is not for regular use. It should only be used as needed. What may interact with this medication? Do not take this medication with any of the following: General anesthetics, such as desflurane, isoflurane, sevoflurane This medication may also interact with the following: Antihistamines for allergy, cough, and cold Certain medications for blood pressure, heart disease, irregular heart beat Certain medications for mental health conditions Certain medications for Parkinson disease, such as entacapone Digoxin Diuretics Doxapram Ergot alkaloids, such as dihydroergotamine, ergonovine, ergotamine, methylergonovine Levothyroxine MAOIs, such as Marplan, Nardil, and Parnate Oxytocin Phenothiazines, such as chlorpromazine, prochlorperazine, thioridazine Steroid medications, such as prednisone  or cortisone Theophylline This list may not describe all possible interactions. Give your health care provider a list of all the medicines, herbs, non-prescription drugs, or dietary supplements you use. Also tell them if you smoke, drink alcohol, or use illegal drugs. Some items may interact with your medicine. What should I watch for while using this medication? Visit your care team for regular checks on your progress. Tell your care team if your symptoms do not start to get better or if they get worse. Call emergency services if you have trouble breathing. What side effects may I notice from receiving this medication? Side effects that you should report to your care team as soon as possible: Allergic reactions--skin rash, itching, hives, swelling of the face, lips, tongue, or throat Heart attack--pain or tightness in the chest, shoulders, arms, or jaw, nausea, shortness of  breath,  cold or clammy skin, feeling faint or lightheaded Heart rhythm changes--fast or irregular heartbeat, dizziness, feeling faint or lightheaded, chest pain, trouble breathing Kidney injury--decrease in the amount of urine, swelling of the ankles, hands, or feet Pain, redness, or irritation at injection site Side effects that usually do not require medical attention (report to your care team if they continue or are bothersome): Anxiety, nervousness Dizziness Headache Heart palpitations--rapid, pounding, or irregular heartbeat Muscle weakness Nausea Pale skin, loss of color in lining of the eyelids, inner mouth, or nails Sweating Tremors or shaking Vomiting This list may not describe all possible side effects. Call your doctor for medical advice about side effects. You may report side effects to FDA at 1-800-FDA-1088. Where should I keep my medication? Keep out of the reach of children and pets. Store at room temperature between 20 and 25 degrees C (68 and 77 degrees F). Do not freeze. Avoid exposure to extreme heat or cold. For example, do not keep it in a vehicle's glove box. Protect from light. Keep it in the outer case until you are ready to take it. Get rid of any unused medication after the expiration date. To get rid of medications that are no longer needed or have expired: Take the medication to a medication take-back program. Check with your pharmacy or law enforcement to find a location. If you cannot return the medication, ask your pharmacist or care team how to get rid of this medication safely. NOTE: This sheet is a summary. It may not cover all possible information. If you have questions about this medicine, talk to your doctor, pharmacist, or health care provider.  2024 Elsevier/Gold Standard (2022-04-08 00:00:00)

## 2023-07-13 ENCOUNTER — Encounter: Payer: Self-pay | Admitting: Physical Therapy

## 2023-07-13 ENCOUNTER — Ambulatory Visit: Admitting: Physical Therapy

## 2023-07-13 DIAGNOSIS — M6281 Muscle weakness (generalized): Secondary | ICD-10-CM | POA: Diagnosis not present

## 2023-07-13 DIAGNOSIS — M542 Cervicalgia: Secondary | ICD-10-CM | POA: Diagnosis not present

## 2023-07-13 NOTE — Therapy (Signed)
 OUTPATIENT PHYSICAL THERAPY CERVICAL TREATMENT   Patient Name: Tracey Taylor MRN: [id number] DOB:05/23/1970, 53 y.o., female Today's Date: 07/13/2023  END OF SESSION:  PT End of Session - 07/13/23 1302     Visit Number 2    Number of Visits 16    Date for PT Re-Evaluation 09/19/23    Authorization Type [employer]    PT Start Time 1302    PT Stop Time 1342    PT Time Calculation (min) 40 min    Activity Tolerance Patient limited by pain    Behavior During Therapy WFL for tasks assessed/performed             Past Medical History:  Diagnosis Date   Headache    Past Surgical History:  Procedure Laterality Date   CERVICAL BIOPSY  W/ LOOP ELECTRODE EXCISION N/A 02/06/2013   CYSTOSCOPY N/A 07/31/2019   Procedure: CYSTOSCOPY;  Surgeon: Matt Song, MD;  Location: [facility];  Service: Gynecology;  Laterality: N/A;   ECTOPIC PREGNANCY SURGERY N/A 1998   tube removed but does not remember which side   ROBOTIC ASSISTED LAPAROSCOPIC HYSTERECTOMY AND SALPINGECTOMY Bilateral 07/31/2019   Procedure: XI ROBOTIC ASSISTED LAPAROSCOPIC HYSTERECTOMY AND SALPINGECTOMY;  Surgeon: Matt Song, MD;  Location: [facility];  Service: Gynecology;  Laterality: Bilateral;   TUBAL LIGATION  2000   Patient Active Problem List   Diagnosis Date Noted   Allergic reaction of correct medicinal substance properly administered 07/12/2023   Intramural leiomyoma of uterus 04/04/2019   Pancreatic cyst 04/04/2019   Anemia 04/04/2019   Mixed stress and urge urinary incontinence 04/04/2019   Pain in left knee 07/25/2018   Tension headache 03/21/2016   Migraine variant, intractable 03/21/2016   Vertigo 03/21/2016    PCP: Alexander Iba, PA  REFERRING PROVIDER: Syliva Even, MD  REFERRING DIAG: M54.2 (ICD-10-CM) - Cervicalgia   THERAPY DIAG:  Cervicalgia  Muscle weakness (generalized)  Rationale for Evaluation and Treatment: Rehabilitation  ONSET  DATE: years, 3 weeks ago last episode  SUBJECTIVE:                                                                                                                                                                                                         SUBJECTIVE STATEMENT:  07/13/2023 States she was sore after last session. States she has improved her set at work. Feeling better today. Got in for her MRI yesterday.   Eval: States she has had neck pain for years. States it has gotten more intense in the middle a few  weeks ago. States that three weeks ago she started having some dizziness as well.  States that her shoulders also bother her. States that her cross body bag also aggravates her. States she was having some pressure in her chest if she lays on her back or sitting up in certain positions.    Works form home and is sitting all day. Got new mattress, and neck pillow. States she is more comfortable in her new chair at work.  States she has not been able to wear a tight ponytail abrades for a while now because of pain she also has noticed some jaw symptoms and pressure in her ears.  Reports she still has some bouts of dizziness but does not report any current numbness or tingling in her hands.  Reports both sides are equally affected.  She was working out pretty regularly doing body weight stuff about 2 months ago but has not been able to do it since her symptoms increased.  Her muscle relaxer does help but she does not like to take medication and she has an initial consult with a neurologist on July 24.   Hand dominance: Right  PERTINENT HISTORY:  recent dizziness  PAIN:  Are you having pain? Yes: NPRS scale: 2/10 Pain location: center of neck  Pain description: intense, achy Aggravating factors: sitting for long periods of time, braids.   Relieving factors: medication, laying on side  PRECAUTIONS: None  RED FLAGS: None     WEIGHT BEARING RESTRICTIONS: No  FALLS:  Has patient  fallen in last 6 months? No    OCCUPATION: collections, works from home, moderate stress  PLOF: Independent  PATIENT GOALS: to have less pain   NEXT MD VISIT: neuro July 24th  OBJECTIVE:  Note: Objective measures were completed at Evaluation unless otherwise noted.  DIAGNOSTIC FINDINGS:  06/08/23 FINDINGS: On the lateral radiograph, C1-C7 are visualized. Mild straightening of the normal cervical lordosis. Vertebral body heights are well maintained without acute fracture. The lateral masses of C1 are well aligned with C2. The visualized odontoid is intact. Intervertebral disc heights are preserved. No significant neural foraminal stenosis. No prevertebral soft tissue swelling. No abnormal translation of the cervical spine with flexion.   IMPRESSION: Mild straightening of the normal cervical lordosis, which may be due to patient positioning or muscular spasm. Otherwise, no acute fracture or malalignment of the cervical spine.  PATIENT SURVEYS:  Patient-specific activity functional scoring scheme (Point to one number):  "0" represents "unable to perform." "10" represents "able to perform at prior level. 0 1 2 3 4 5 6 7 8 9  10 (Date and Score) Activity Initial  Activity Eval     Wearing braids  0    Wearing cross body bag 5    Working out 0    Additional Additional Total score = sum of the activity scores/number of activities Minimum detectable change (90%CI) for average score = 2 points Minimum detectable change (90%CI) for single activity score = 3 points PSFS developed by: [place] Spitz., & Binkley, J. (1995). Assessing disability and change on individual  patients: a report of a patient specific measure. Physiotherapy [place], [phone], [phone]. Reproduced with the permission of the authors  Score: 5/3= 1.66   COGNITION: Overall cognitive status: Within functional limits for tasks assessed  SENSATION: Not tested  POSTURE: rounded shoulders,  forward head, and flexed trunk   PALPATION: Increased resting tone and pain with palpation along B cervical paraspinals, UT, pecs, suboccipitals, unable to  assess joint mobility due to pain   CERVICAL ROM:   Active ROM A/PROM (deg) eval  Flexion WFL  Extension WFL*  Right lateral flexion   Left lateral flexion   Right rotation 75  Left rotation 65*   (Blank rows = not tested)    UE Measurements Upper Extremity Right EVAL Left EVAL   A/PROM MMT A/PROM MMT  Shoulder Flexion WFL 4 WFL 4  Shoulder Extension      Shoulder Abduction WFL 4  4-*  Shoulder Adduction      Shoulder Internal Rotation WFL 4+ WFL 4+  Shoulder External Rotation WFL 3+ WFL 3+  Elbow Flexion      Elbow Extension      Wrist Flexion      Wrist Extension      Wrist Supination      Wrist Pronation      Wrist Ulnar Deviation      Wrist Radial Deviation      Grip Strength NA  NA     (Blank rows = not tested)   * pain   TREATMENT DATE:                                                                                                                               07/13/2023  Therapeutic Exercise:  Aerobic: Supine: chin tucks "bobble heads 2 minutes - increased pain, PROM of neck - tolerated mildly well, towel roll down spine - tolerated well with scap retraction 8 minutes total  Prone:  Seated:  Standing: Neuromuscular Re-education:  Manual Therapy: STM to temporalis, cervical paraspinals, suboccipitals and facial muscles, gentle grade I/II joint mob to cervical spine, cervical traction - tolerated well  Therapeutic Activity: Self Care: Trigger Point Dry Needling:  Modalities: thermotherapy to cervical spine during manual interventions.     PATIENT EDUCATION:  Education details: on HEP and rationale behind interventions  Person educated: Patient Education method: Explanation, Demonstration, and Handouts Education comprehension: verbalized understanding   HOME EXERCISE PROGRAM: No medbridge at  this time,  Body scan, self massage to neck/head/chest muscles  ASSESSMENT:  CLINICAL IMPRESSION:  07/13/2023 Session focused on pain management. Improved tolerance to manual work on this date. Slight increase in symptoms after cervical active motion. This reduced with gentle cervical traction. Added thoracic mobility instead secondary to poor cervical exercise tolerance on this date. No pain noted end of session. Will continue with current POC as tolerated.   Eval: Patient presents to physical therapy with complaints of neck and shoulder pain that started a while ago but recently increased in intensity of the last 3 weeks.  Patient presents with cervicogenic headache symptoms as well as increased tension throughout cervical musculature.  Educated patient on current finding as well as benefits of physical therapy.  Patient with significant muscle guarding and pain with light palpation.  Encourage patient to perform gentle self massage to help down regulate central nervous system.  Patient would greatly benefit  from skilled PT to address physical impairments and return her to optimal function.  OBJECTIVE IMPAIRMENTS: decreased activity tolerance, decreased mobility, decreased ROM, decreased strength, improper body mechanics, postural dysfunction, and pain.   ACTIVITY LIMITATIONS: carrying, sitting, sleeping, reach over head, and hygiene/grooming  PARTICIPATION LIMITATIONS: community activity and occupation  PERSONAL FACTORS: Fitness, Time since onset of injury/illness/exacerbation, and 1 comorbidity: dizziness are also affecting patient's functional outcome.   REHAB POTENTIAL: Good  CLINICAL DECISION MAKING: Stable/uncomplicated  EVALUATION COMPLEXITY: Low   GOALS: Goals reviewed with patient? yes  SHORT TERM GOALS: Target date: 08/08/2023   Patient will be independent in self management strategies to improve quality of life and functional outcomes. Baseline: New Program Goal status:  INITIAL  2.  Patient will report at least 50% improvement in overall symptoms and/or function to demonstrate improved functional mobility Baseline: 0% better Goal status: INITIAL  3.  Patient will be able to regularly wear cross body back without increase in symptoms Baseline: Painful Goal status: INITIAL    LONG TERM GOALS: Target date: 09/19/2023    Patient will report at least 75% improvement in overall symptoms and/or function to demonstrate improved functional mobility Baseline: 0% better Goal status: INITIAL  2.  Patient will score at least 2 points higher on PSFS average to demonstrate change in overall function. Baseline: see above Goal status: INITIAL  3.  Patient will be able to regularly workout without increase in symptoms returning to prior level of function Baseline: Unable Goal status: INITIAL     PLAN:  PT FREQUENCY: 1-2x/week for a total to 16 visits over 12 week certification period  PT DURATION: 12 weeks  PLANNED INTERVENTIONS: 97110-Therapeutic exercises, 97530- Therapeutic activity, 97112- Neuromuscular re-education, 97535- Self Care, 78295- Manual therapy, [phone]- Gait training, [phone]- Orthotic Fit/training, [phone]- Canalith repositioning, [id number]- Aquatic Therapy, [id number]- Electrical stimulation (unattended), [phone]- Ionotophoresis 4mg /ml Dexamethasone , Patient/Family education, Balance training, Stair training, Taping, Dry Needling, Joint mobilization, Joint manipulation, Spinal manipulation, Spinal mobilization, Cryotherapy, and Moist heat   PLAN FOR NEXT SESSION: STM, DN if indicated/tolerated, f/u body scan and self massage, Ergonomic set up, thoracic mobility (towel roll, chin tucks/neck ROM, serratus +, breathing exercises   2:03 PM, 07/13/23 Tabitha Ewings, DPT Physical Therapy [facility]

## 2023-07-14 ENCOUNTER — Ambulatory Visit: Admitting: Family Medicine

## 2023-07-14 ENCOUNTER — Ambulatory Visit: Payer: Self-pay | Admitting: Neurology

## 2023-07-17 ENCOUNTER — Encounter: Payer: Self-pay | Admitting: Physical Therapy

## 2023-07-17 ENCOUNTER — Ambulatory Visit: Admitting: Physical Therapy

## 2023-07-17 ENCOUNTER — Telehealth: Payer: Self-pay | Admitting: Neurology

## 2023-07-17 DIAGNOSIS — M6281 Muscle weakness (generalized): Secondary | ICD-10-CM

## 2023-07-17 DIAGNOSIS — M542 Cervicalgia: Secondary | ICD-10-CM

## 2023-07-17 NOTE — Telephone Encounter (Signed)
 Pt called  in regards to wanting to know MRI  Results

## 2023-07-17 NOTE — Telephone Encounter (Signed)
 Called the patient. Advised that while the report had been posted and read by provider but official result report had not yet been reviewed. I advised the MRI brain and the MRA of neck was normal.   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  Advised the patient of the finding and that it doesn't show complete blockage but narrowing in flow of the left vertebral artery. Informed the patient that once Dr Janett Medin officially reviews the result and lets us  know his input on this imaging, we will reach back out. Pt verbalized understanding.

## 2023-07-17 NOTE — Therapy (Signed)
 OUTPATIENT PHYSICAL THERAPY CERVICAL TREATMENT   Patient Name: Tracey Taylor MRN: [id number] DOB:1970-09-05, 53 y.o., female Today's Date: 07/17/2023  END OF SESSION:  PT End of Session - 07/17/23 0844     Visit Number 3    Number of Visits 16    Date for PT Re-Evaluation 09/19/23    Authorization Type U[employer]    PT Start Time 0845    PT Stop Time 0925    PT Time Calculation (min) 40 min    Activity Tolerance Patient limited by pain    Behavior During Therapy [facility] for tasks assessed/performed             Past Medical History:  Diagnosis Date   Headache    Past Surgical History:  Procedure Laterality Date   CERVICAL BIOPSY  W/ LOOP ELECTRODE EXCISION N/A 02/06/2013   CYSTOSCOPY N/A 07/31/2019   Procedure: CYSTOSCOPY;  Surgeon: Matt Song, MD;  Location: [facility];  Service: Gynecology;  Laterality: N/A;   ECTOPIC PREGNANCY SURGERY N/A 1998   tube removed but does not remember which side   ROBOTIC ASSISTED LAPAROSCOPIC HYSTERECTOMY AND SALPINGECTOMY Bilateral 07/31/2019   Procedure: XI ROBOTIC ASSISTED LAPAROSCOPIC HYSTERECTOMY AND SALPINGECTOMY;  Surgeon: Matt Song, MD;  Location: [facility] ;  Service: Gynecology;  Laterality: Bilateral;   TUBAL LIGATION  2000   Patient Active Problem List   Diagnosis Date Noted   Allergic reaction of correct medicinal substance properly administered 07/12/2023   Intramural leiomyoma of uterus 04/04/2019   Pancreatic cyst 04/04/2019   Anemia 04/04/2019   Mixed stress and urge urinary incontinence 04/04/2019   Pain in left knee 07/25/2018   Tension headache 03/21/2016   Migraine variant, intractable 03/21/2016   Vertigo 03/21/2016    PCP: Alexander Iba, PA  REFERRING PROVIDER: Syliva Even, MD  REFERRING DIAG: M54.2 (ICD-10-CM) - Cervicalgia   THERAPY DIAG:  Cervicalgia  Muscle weakness (generalized)  Rationale for Evaluation and Treatment: Rehabilitation  ONSET  DATE: years, 3 weeks ago last episode  SUBJECTIVE:                                                                                                                                                                                                         SUBJECTIVE STATEMENT:  07/17/2023 States she is having some light headedness coming and goes. States that it happened yesterday and she fel like she had to lay down. Overall frequency is less.    Eval: States she has had neck pain for years. States it has gotten more intense  in the middle a few weeks ago. States that three weeks ago she started having some dizziness as well.  States that her shoulders also bother her. States that her cross body bag also aggravates her. States she was having some pressure in her chest if she lays on her back or sitting up in certain positions.    Works form home and is sitting all day. Got new mattress, and neck pillow. States she is more comfortable in her new chair at work.  States she has not been able to wear a tight ponytail abrades for a while now because of pain she also has noticed some jaw symptoms and pressure in her ears.  Reports she still has some bouts of dizziness but does not report any current numbness or tingling in her hands.  Reports both sides are equally affected.  She was working out pretty regularly doing body weight stuff about 2 months ago but has not been able to do it since her symptoms increased.  Her muscle relaxer does help but she does not like to take medication and she has an initial consult with a neurologist on July 24.   Hand dominance: Right  PERTINENT HISTORY:  recent dizziness  PAIN:  Are you having pain? Yes: NPRS scale: 1/10 Pain location: center of neck  Pain description: intense, achy Aggravating factors: sitting for long periods of time, braids.   Relieving factors: medication, laying on side  PRECAUTIONS: None  RED FLAGS: None     WEIGHT BEARING RESTRICTIONS:  No  FALLS:  Has patient fallen in last 6 months? No    OCCUPATION: collections, works from home, moderate stress  PLOF: Independent  PATIENT GOALS: to have less pain   NEXT MD VISIT: neuro July 24th  OBJECTIVE:  Note: Objective measures were completed at Evaluation unless otherwise noted.  DIAGNOSTIC FINDINGS:  06/08/23 FINDINGS: On the lateral radiograph, C1-C7 are visualized. Mild straightening of the normal cervical lordosis. Vertebral body heights are well maintained without acute fracture. The lateral masses of C1 are well aligned with C2. The visualized odontoid is intact. Intervertebral disc heights are preserved. No significant neural foraminal stenosis. No prevertebral soft tissue swelling. No abnormal translation of the cervical spine with flexion.   IMPRESSION: Mild straightening of the normal cervical lordosis, which may be due to patient positioning or muscular spasm. Otherwise, no acute fracture or malalignment of the cervical spine.  PATIENT SURVEYS:  Patient-specific activity functional scoring scheme (Point to one number):  "0" represents "unable to perform." "10" represents "able to perform at prior level. 0 1 2 3 4 5 6 7 8 9  10 (Date and Score) Activity Initial  Activity Eval     Wearing braids  0    Wearing cross body bag 5    Working out 0    Additional Additional Total score = sum of the activity scores/number of activities Minimum detectable change (90%CI) for average score = 2 points Minimum detectable change (90%CI) for single activity score = 3 points PSFS developed by: [place] Spitz., & Binkley, J. (1995). Assessing disability and change on individual  patients: a report of a patient specific measure. Physiotherapy [place], 47, [phone]. Reproduced with the permission of the authors  Score: 5/3= 1.66   COGNITION: Overall cognitive status: Within functional limits for tasks assessed  SENSATION: Not  tested  POSTURE: rounded shoulders, forward head, and flexed trunk   PALPATION: Increased resting tone and pain with palpation along B cervical paraspinals,  UT, pecs, suboccipitals, unable to assess joint mobility due to pain   CERVICAL ROM:   Active ROM A/PROM (deg) eval  Flexion WFL  Extension WFL*  Right lateral flexion   Left lateral flexion   Right rotation 75  Left rotation 65*   (Blank rows = not tested)    UE Measurements Upper Extremity Right EVAL Left EVAL   A/PROM MMT A/PROM MMT  Shoulder Flexion WFL 4 WFL 4  Shoulder Extension      Shoulder Abduction WFL 4  4-*  Shoulder Adduction      Shoulder Internal Rotation WFL 4+ WFL 4+  Shoulder External Rotation WFL 3+ WFL 3+  Elbow Flexion      Elbow Extension      Wrist Flexion      Wrist Extension      Wrist Supination      Wrist Pronation      Wrist Ulnar Deviation      Wrist Radial Deviation      Grip Strength NA  NA     (Blank rows = not tested)   * pain   TREATMENT DATE:                                                                                                                               07/17/2023  Therapeutic Exercise:  Aerobic: Supine: shoulder scaption x20 B, scapular protraction x20, horizontal shoulder abd x20 B, neck iso all directions PT performed --> to patient performed ROT and SB  Prone:  Seated:  Standing: Neuromuscular Re-education:  Manual Therapy: STM to temporalis, cervical paraspinals, suboccipitals and facial muscles,  cervical traction - tolerated well  Therapeutic Activity: Self Care: Trigger Point Dry Needling:  Modalities: thermotherapy to cervical spine during manual interventions.     PATIENT EDUCATION:  Education details: on HEP and rationale behind interventions  Person educated: Patient Education method: Explanation, Demonstration, and Handouts Education comprehension: verbalized understanding   HOME EXERCISE PROGRAM: No medbridge at this time,  Body  scan, self massage to neck/head/chest muscles  ASSESSMENT:  CLINICAL IMPRESSION:  07/17/2023 Continued with manual interventions which were tolerated well. Added shoulder ROM secondary to neck exercises note tolerated well on previous date. No increase in symptoms noted with these exercises. Added to HEP. Overall patient doing well and will continue to benefit form skilled PT at this time.   Eval: Patient presents to physical therapy with complaints of neck and shoulder pain that started a while ago but recently increased in intensity of the last 3 weeks.  Patient presents with cervicogenic headache symptoms as well as increased tension throughout cervical musculature.  Educated patient on current finding as well as benefits of physical therapy.  Patient with significant muscle guarding and pain with light palpation.  Encourage patient to perform gentle self massage to help down regulate central nervous system.  Patient would greatly benefit from skilled PT to address physical impairments and return her to optimal  function.  OBJECTIVE IMPAIRMENTS: decreased activity tolerance, decreased mobility, decreased ROM, decreased strength, improper body mechanics, postural dysfunction, and pain.   ACTIVITY LIMITATIONS: carrying, sitting, sleeping, reach over head, and hygiene/grooming  PARTICIPATION LIMITATIONS: community activity and occupation  PERSONAL FACTORS: Fitness, Time since onset of injury/illness/exacerbation, and 1 comorbidity: dizziness are also affecting patient's functional outcome.   REHAB POTENTIAL: Good  CLINICAL DECISION MAKING: Stable/uncomplicated  EVALUATION COMPLEXITY: Low   GOALS: Goals reviewed with patient? yes  SHORT TERM GOALS: Target date: 08/08/2023   Patient will be independent in self management strategies to improve quality of life and functional outcomes. Baseline: New Program Goal status: INITIAL  2.  Patient will report at least 50% improvement in overall  symptoms and/or function to demonstrate improved functional mobility Baseline: 0% better Goal status: INITIAL  3.  Patient will be able to regularly wear cross body back without increase in symptoms Baseline: Painful Goal status: INITIAL    LONG TERM GOALS: Target date: 09/19/2023    Patient will report at least 75% improvement in overall symptoms and/or function to demonstrate improved functional mobility Baseline: 0% better Goal status: INITIAL  2.  Patient will score at least 2 points higher on PSFS average to demonstrate change in overall function. Baseline: see above Goal status: INITIAL  3.  Patient will be able to regularly workout without increase in symptoms returning to prior level of function Baseline: Unable Goal status: INITIAL     PLAN:  PT FREQUENCY: 1-2x/week for a total to 16 visits over 12 week certification period  PT DURATION: 12 weeks  PLANNED INTERVENTIONS: 97110-Therapeutic exercises, 97530- Therapeutic activity, 97112- Neuromuscular re-education, 97535- Self Care, 11914- Manual therapy, [phone]- Gait training, [phone]- Orthotic Fit/training, [phone]- Canalith repositioning, [id number]- Aquatic Therapy, 97014- Electrical stimulation (unattended), [phone]- Ionotophoresis 4mg /ml Dexamethasone , Patient/Family education, Balance training, Stair training, Taping, Dry Needling, Joint mobilization, Joint manipulation, Spinal manipulation, Spinal mobilization, Cryotherapy, and Moist heat   PLAN FOR NEXT SESSION: STM, DN if indicated/tolerated, f/u body scan and self massage, Ergonomic set up, thoracic mobility (towel roll, chin tucks/neck ROM, serratus +, breathing exercises   9:26 AM, 07/17/23 Tabitha Ewings, DPT Physical Therapy with [facility]

## 2023-07-18 ENCOUNTER — Other Ambulatory Visit: Payer: Self-pay | Admitting: Neurology

## 2023-07-18 DIAGNOSIS — R42 Dizziness and giddiness: Secondary | ICD-10-CM

## 2023-07-18 DIAGNOSIS — M542 Cervicalgia: Secondary | ICD-10-CM

## 2023-07-18 NOTE — Telephone Encounter (Signed)
 Pt called in and I reviewed her questions and recommended 81 mg asa daily to help reduce chances of future stroke.  She is also working with vestibular rehab for lightheaded/ dizziness.  She is scheduled for 6 month follow up but advised if she has issues or concerns arise, she can always reach out and let us  know. Pt verbalized understanding.

## 2023-07-20 ENCOUNTER — Ambulatory Visit: Admitting: Physical Therapy

## 2023-07-20 ENCOUNTER — Encounter: Payer: Self-pay | Admitting: Physical Therapy

## 2023-07-20 DIAGNOSIS — M6281 Muscle weakness (generalized): Secondary | ICD-10-CM | POA: Diagnosis not present

## 2023-07-20 DIAGNOSIS — M542 Cervicalgia: Secondary | ICD-10-CM | POA: Diagnosis not present

## 2023-07-20 NOTE — Therapy (Signed)
 OUTPATIENT PHYSICAL THERAPY CERVICAL TREATMENT   Patient Name: Tracey Taylor MRN: [id number] DOB:03-04-1970, 53 y.o., female Today's Date: 07/20/2023  END OF SESSION:  PT End of Session - 07/20/23 0934     Visit Number 4    Number of Visits 16    Date for PT Re-Evaluation 09/19/23    Authorization Type UHC    PT Start Time 0934    PT Stop Time 1012    PT Time Calculation (min) 38 min    Activity Tolerance Patient limited by pain    Behavior During Therapy [facility] for tasks assessed/performed          Past Medical History:  Diagnosis Date   Headache    Past Surgical History:  Procedure Laterality Date   CERVICAL BIOPSY  W/ LOOP ELECTRODE EXCISION N/A 02/06/2013   CYSTOSCOPY N/A 07/31/2019   Procedure: CYSTOSCOPY;  Surgeon: Matt Song, MD;  Location: [facility];  Service: Gynecology;  Laterality: N/A;   ECTOPIC PREGNANCY SURGERY N/A 1998   tube removed but does not remember which side   ROBOTIC ASSISTED LAPAROSCOPIC HYSTERECTOMY AND SALPINGECTOMY Bilateral 07/31/2019   Procedure: XI ROBOTIC ASSISTED LAPAROSCOPIC HYSTERECTOMY AND SALPINGECTOMY;  Surgeon: Matt Song, MD;  Location: [facility];  Service: Gynecology;  Laterality: Bilateral;   TUBAL LIGATION  2000   Patient Active Problem List   Diagnosis Date Noted   Allergic reaction of correct medicinal substance properly administered 07/12/2023   Intramural leiomyoma of uterus 04/04/2019   Pancreatic cyst 04/04/2019   Anemia 04/04/2019   Mixed stress and urge urinary incontinence 04/04/2019   Pain in left knee 07/25/2018   Tension headache 03/21/2016   Migraine variant, intractable 03/21/2016   Vertigo 03/21/2016    PCP: Alexander Iba, PA  REFERRING PROVIDER: Syliva Even, MD  REFERRING DIAG: M54.2 (ICD-10-CM) - Cervicalgia   THERAPY DIAG:  Cervicalgia  Muscle weakness (generalized)  Rationale for Evaluation and Treatment: Rehabilitation  ONSET DATE:  years, 3 weeks ago last episode  SUBJECTIVE:                                                                                                                                                                                                         SUBJECTIVE STATEMENT:  07/20/2023 States that she is feeling good no lightheaded and felt good after last session.   Eval: States she has had neck pain for years. States it has gotten more intense in the middle a few weeks ago. States that three weeks ago she started having some dizziness as  well.  States that her shoulders also bother her. States that her cross body bag also aggravates her. States she was having some pressure in her chest if she lays on her back or sitting up in certain positions.    Works form home and is sitting all day. Got new mattress, and neck pillow. States she is more comfortable in her new chair at work.  States she has not been able to wear a tight ponytail abrades for a while now because of pain she also has noticed some jaw symptoms and pressure in her ears.  Reports she still has some bouts of dizziness but does not report any current numbness or tingling in her hands.  Reports both sides are equally affected.  She was working out pretty regularly doing body weight stuff about 2 months ago but has not been able to do it since her symptoms increased.  Her muscle relaxer does help but she does not like to take medication and she has an initial consult with a neurologist on July 24.   Hand dominance: Right  PERTINENT HISTORY:  recent dizziness  PAIN:  Are you having pain? Yes: NPRS scale: 1/10 Pain location: center of neck  Pain description: intense, achy Aggravating factors: sitting for long periods of time, braids.   Relieving factors: medication, laying on side  PRECAUTIONS: None  RED FLAGS: None     WEIGHT BEARING RESTRICTIONS: No  FALLS:  Has patient fallen in last 6 months? No    OCCUPATION: collections,  works from home, moderate stress  PLOF: Independent  PATIENT GOALS: to have less pain   NEXT MD VISIT: neuro July 24th  OBJECTIVE:  Note: Objective measures were completed at Evaluation unless otherwise noted.  DIAGNOSTIC FINDINGS:  06/08/23 FINDINGS: On the lateral radiograph, C1-C7 are visualized. Mild straightening of the normal cervical lordosis. Vertebral body heights are well maintained without acute fracture. The lateral masses of C1 are well aligned with C2. The visualized odontoid is intact. Intervertebral disc heights are preserved. No significant neural foraminal stenosis. No prevertebral soft tissue swelling. No abnormal translation of the cervical spine with flexion.   IMPRESSION: Mild straightening of the normal cervical lordosis, which may be due to patient positioning or muscular spasm. Otherwise, no acute fracture or malalignment of the cervical spine.  PATIENT SURVEYS:  Patient-specific activity functional scoring scheme (Point to one number):  0 represents "unable to perform." 10 represents "able to perform at prior level. 0 1 2 3 4 5 6 7 8 9  10 (Date and Score) Activity Initial  Activity Eval     Wearing braids  0    Wearing cross body bag 5    Working out 0    Additional Additional Total score = sum of the activity scores/number of activities Minimum detectable change (90%CI) for average score = 2 points Minimum detectable change (90%CI) for single activity score = 3 points PSFS developed by: [place] Spitz., & Binkley, J. (1995). Assessing disability and change on individual  patients: a report of a patient specific measure. Physiotherapy [place], 47, [phone]. Reproduced with the permission of the authors  Score: 5/3= 1.66   COGNITION: Overall cognitive status: Within functional limits for tasks assessed  SENSATION: Not tested  POSTURE: rounded shoulders, forward head, and flexed trunk   PALPATION: Increased  resting tone and pain with palpation along B cervical paraspinals, UT, pecs, suboccipitals, unable to assess joint mobility due to pain   CERVICAL ROM:   Active  ROM A/PROM (deg) eval  Flexion WFL  Extension WFL*  Right lateral flexion   Left lateral flexion   Right rotation 75  Left rotation 65*   (Blank rows = not tested)    UE Measurements Upper Extremity Right EVAL Left EVAL   A/PROM MMT A/PROM MMT  Shoulder Flexion WFL 4 WFL 4  Shoulder Extension      Shoulder Abduction WFL 4  4-*  Shoulder Adduction      Shoulder Internal Rotation WFL 4+ WFL 4+  Shoulder External Rotation WFL 3+ WFL 3+  Elbow Flexion      Elbow Extension      Wrist Flexion      Wrist Extension      Wrist Supination      Wrist Pronation      Wrist Ulnar Deviation      Wrist Radial Deviation      Grip Strength NA  NA     (Blank rows = not tested)   * pain   TREATMENT DATE:                                                                                                                               07/20/2023  Therapeutic Exercise:  REVIEW of entire HEP Supine: relaxed breathing with support and heat- guided body scan - 3 minutes,  scapular protraction 2 minutes, horizontal shoulder abd 1 minute B, shoulder scaption with red band 1 minutes x2 rounds, shoulder ER with red band 2 minutes - arms at sides S/l: book stretch 2 minutes B Prone:  Seated:  Standing: Neuromuscular Re-education:  Manual Therapy: STM to R UT, R cervical paraspinals, suboccipitals and SCM,  cervical traction - tolerated well  Therapeutic Activity: Self Care: Trigger Point Dry Needling:  Modalities: thermotherapy to cervical spine during manual interventions.     PATIENT EDUCATION:  Education details: on HEP and rationale behind interventions  Person educated: Patient Education method: Explanation, Demonstration, and Handouts Education comprehension: verbalized understanding   HOME EXERCISE PROGRAM: [id number] Body  scan, self massage to neck/head/chest muscles  ASSESSMENT:  CLINICAL IMPRESSION:  07/20/2023 Focused on mobility and strengthening on this date. Tolerated all exercises well. Slight increase in pain with right trunk rotation but this likely due to lack of neck support. Responded well to manual and no pain noted end of session. Will continue with current POC as tolerated.   Eval: Patient presents to physical therapy with complaints of neck and shoulder pain that started a while ago but recently increased in intensity of the last 3 weeks.  Patient presents with cervicogenic headache symptoms as well as increased tension throughout cervical musculature.  Educated patient on current finding as well as benefits of physical therapy.  Patient with significant muscle guarding and pain with light palpation.  Encourage patient to perform gentle self massage to help down regulate central nervous system.  Patient would greatly benefit from skilled PT to address physical impairments and  return her to optimal function.  OBJECTIVE IMPAIRMENTS: decreased activity tolerance, decreased mobility, decreased ROM, decreased strength, improper body mechanics, postural dysfunction, and pain.   ACTIVITY LIMITATIONS: carrying, sitting, sleeping, reach over head, and hygiene/grooming  PARTICIPATION LIMITATIONS: community activity and occupation  PERSONAL FACTORS: Fitness, Time since onset of injury/illness/exacerbation, and 1 comorbidity: dizziness are also affecting patient's functional outcome.   REHAB POTENTIAL: Good  CLINICAL DECISION MAKING: Stable/uncomplicated  EVALUATION COMPLEXITY: Low   GOALS: Goals reviewed with patient? yes  SHORT TERM GOALS: Target date: 08/08/2023   Patient will be independent in self management strategies to improve quality of life and functional outcomes. Baseline: New Program Goal status: INITIAL  2.  Patient will report at least 50% improvement in overall symptoms and/or  function to demonstrate improved functional mobility Baseline: 0% better Goal status: INITIAL  3.  Patient will be able to regularly wear cross body back without increase in symptoms Baseline: Painful Goal status: INITIAL    LONG TERM GOALS: Target date: 09/19/2023    Patient will report at least 75% improvement in overall symptoms and/or function to demonstrate improved functional mobility Baseline: 0% better Goal status: INITIAL  2.  Patient will score at least 2 points higher on PSFS average to demonstrate change in overall function. Baseline: see above Goal status: INITIAL  3.  Patient will be able to regularly workout without increase in symptoms returning to prior level of function Baseline: Unable Goal status: INITIAL     PLAN:  PT FREQUENCY: 1-2x/week for a total to 16 visits over 12 week certification period  PT DURATION: 12 weeks  PLANNED INTERVENTIONS: 97110-Therapeutic exercises, 97530- Therapeutic activity, 97112- Neuromuscular re-education, 97535- Self Care, 16109- Manual therapy, [phone]- Gait training, [phone]- Orthotic Fit/training, [phone]- Canalith repositioning, [id number]- Aquatic Therapy, 97014- Electrical stimulation (unattended), [phone]- Ionotophoresis 4mg /ml Dexamethasone , Patient/Family education, Balance training, Stair training, Taping, Dry Needling, Joint mobilization, Joint manipulation, Spinal manipulation, Spinal mobilization, Cryotherapy, and Moist heat   PLAN FOR NEXT SESSION: STM, DN if indicated/tolerated, f/u body scan and self massage, Ergonomic set up, thoracic mobility (towel roll, chin tucks/neck ROM, serratus +, breathing exercises   10:15 AM, 07/20/23 Tabitha Ewings, DPT Physical Therapy with [facility]

## 2023-07-22 ENCOUNTER — Other Ambulatory Visit

## 2023-07-24 ENCOUNTER — Encounter: Payer: Self-pay | Admitting: Neurology

## 2023-07-24 ENCOUNTER — Encounter: Payer: Self-pay | Admitting: Physical Therapy

## 2023-07-24 ENCOUNTER — Ambulatory Visit: Admitting: Physical Therapy

## 2023-07-24 DIAGNOSIS — M542 Cervicalgia: Secondary | ICD-10-CM | POA: Diagnosis not present

## 2023-07-24 DIAGNOSIS — M6281 Muscle weakness (generalized): Secondary | ICD-10-CM | POA: Diagnosis not present

## 2023-07-24 NOTE — Therapy (Signed)
 OUTPATIENT PHYSICAL THERAPY CERVICAL TREATMENT   Patient Name: Tracey Taylor MRN: [id number] DOB:01-12-1971, 53 y.o., female Today's Date: 07/24/2023  END OF SESSION:  PT End of Session - 07/24/23 0843     Visit Number 5    Number of Visits 16    Date for PT Re-Evaluation 09/19/23    Authorization Type U[employer]    PT Start Time 0845    PT Stop Time 0925    PT Time Calculation (min) 40 min    Activity Tolerance Patient limited by pain    Behavior During Therapy [facility] for tasks assessed/performed          Past Medical History:  Diagnosis Date   Headache    Past Surgical History:  Procedure Laterality Date   CERVICAL BIOPSY  W/ LOOP ELECTRODE EXCISION N/A 02/06/2013   CYSTOSCOPY N/A 07/31/2019   Procedure: CYSTOSCOPY;  Surgeon: Matt Song, MD;  Location: [facility];  Service: Gynecology;  Laterality: N/A;   ECTOPIC PREGNANCY SURGERY N/A 1998   tube removed but does not remember which side   ROBOTIC ASSISTED LAPAROSCOPIC HYSTERECTOMY AND SALPINGECTOMY Bilateral 07/31/2019   Procedure: XI ROBOTIC ASSISTED LAPAROSCOPIC HYSTERECTOMY AND SALPINGECTOMY;  Surgeon: Matt Song, MD;  Location: [facility];  Service: Gynecology;  Laterality: Bilateral;   TUBAL LIGATION  2000   Patient Active Problem List   Diagnosis Date Noted   Allergic reaction of correct medicinal substance properly administered 07/12/2023   Intramural leiomyoma of uterus 04/04/2019   Pancreatic cyst 04/04/2019   Anemia 04/04/2019   Mixed stress and urge urinary incontinence 04/04/2019   Pain in left knee 07/25/2018   Tension headache 03/21/2016   Migraine variant, intractable 03/21/2016   Vertigo 03/21/2016    PCP: Alexander Iba, PA  REFERRING PROVIDER: Syliva Even, MD  REFERRING DIAG: M54.2 (ICD-10-CM) - Cervicalgia   THERAPY DIAG:  Cervicalgia  Muscle weakness (generalized)  Rationale for Evaluation and Treatment: Rehabilitation  ONSET DATE:  years, 3 weeks ago last episode  SUBJECTIVE:                                                                                                                                                                                                         SUBJECTIVE STATEMENT:  07/24/2023 States she has been good but a little light headedness. No current pain. No soreness after last session. Started wearing cardiac monitor    Eval: States she has had neck pain for years. States it has gotten more intense in the middle a few weeks ago. States that  three weeks ago she started having some dizziness as well.  States that her shoulders also bother her. States that her cross body bag also aggravates her. States she was having some pressure in her chest if she lays on her back or sitting up in certain positions.    Works form home and is sitting all day. Got new mattress, and neck pillow. States she is more comfortable in her new chair at work.  States she has not been able to wear a tight ponytail abrades for a while now because of pain she also has noticed some jaw symptoms and pressure in her ears.  Reports she still has some bouts of dizziness but does not report any current numbness or tingling in her hands.  Reports both sides are equally affected.  She was working out pretty regularly doing body weight stuff about 2 months ago but has not been able to do it since her symptoms increased.  Her muscle relaxer does help but she does not like to take medication and she has an initial consult with a neurologist on July 24.   Hand dominance: Right  PERTINENT HISTORY:  recent dizziness  PAIN:  Are you having pain? Yes: NPRS scale: 1/10 Pain location: center of neck  Pain description: intense, achy Aggravating factors: sitting for long periods of time, braids.   Relieving factors: medication, laying on side  PRECAUTIONS: None  RED FLAGS: None     WEIGHT BEARING RESTRICTIONS: No  FALLS:  Has patient  fallen in last 6 months? No    OCCUPATION: collections, works from home, moderate stress  PLOF: Independent  PATIENT GOALS: to have less pain   NEXT MD VISIT: neuro July 24th  OBJECTIVE:  Note: Objective measures were completed at Evaluation unless otherwise noted.  DIAGNOSTIC FINDINGS:  06/08/23 FINDINGS: On the lateral radiograph, C1-C7 are visualized. Mild straightening of the normal cervical lordosis. Vertebral body heights are well maintained without acute fracture. The lateral masses of C1 are well aligned with C2. The visualized odontoid is intact. Intervertebral disc heights are preserved. No significant neural foraminal stenosis. No prevertebral soft tissue swelling. No abnormal translation of the cervical spine with flexion.   IMPRESSION: Mild straightening of the normal cervical lordosis, which may be due to patient positioning or muscular spasm. Otherwise, no acute fracture or malalignment of the cervical spine.  PATIENT SURVEYS:  Patient-specific activity functional scoring scheme (Point to one number):  0 represents "unable to perform." 10 represents "able to perform at prior level. 0 1 2 3 4 5 6 7 8 9  10 (Date and Score) Activity Initial  Activity Eval     Wearing braids  0    Wearing cross body bag 5    Working out 0    Additional Additional Total score = sum of the activity scores/number of activities Minimum detectable change (90%CI) for average score = 2 points Minimum detectable change (90%CI) for single activity score = 3 points PSFS developed by: [place] Spitz., & Binkley, J. (1995). Assessing disability and change on individual  patients: a report of a patient specific measure. Physiotherapy [place], 47, [phone]. Reproduced with the permission of the authors  Score: 5/3= 1.66   COGNITION: Overall cognitive status: Within functional limits for tasks assessed  SENSATION: Not tested  POSTURE: rounded shoulders,  forward head, and flexed trunk   PALPATION: Increased resting tone and pain with palpation along B cervical paraspinals, UT, pecs, suboccipitals, unable to assess joint mobility due  to pain   CERVICAL ROM:   Active ROM A/PROM (deg) eval  Flexion WFL  Extension WFL*  Right lateral flexion   Left lateral flexion   Right rotation 75  Left rotation 65*   (Blank rows = not tested)    UE Measurements Upper Extremity Right EVAL Left EVAL   A/PROM MMT A/PROM MMT  Shoulder Flexion WFL 4 WFL 4  Shoulder Extension      Shoulder Abduction WFL 4  4-*  Shoulder Adduction      Shoulder Internal Rotation WFL 4+ WFL 4+  Shoulder External Rotation WFL 3+ WFL 3+  Elbow Flexion      Elbow Extension      Wrist Flexion      Wrist Extension      Wrist Supination      Wrist Pronation      Wrist Ulnar Deviation      Wrist Radial Deviation      Grip Strength NA  NA     (Blank rows = not tested)   * pain   TREATMENT DATE:                                                                                                                               07/24/2023  Therapeutic Exercise:  REVIEW of entire HEP Supine: relaxed breathing with support and heat- guided body scan - 3 minutes,  scapular protraction 2 minutes, horizontal shoulder abd 1 minute B, neck ROT 2 minutes, chin tucks 1.5 minutes, neck PROM flexion/ROT/SB - tolerated well    S/l:  Prone:  Seated:  Standing: Neuromuscular Re-education: long exhale 5 minutes, box breathing 4 seconds then 3 seconds 5 minutes Manual Therapy: STM to R UT, R cervical paraspinals, suboccipitals and SCM,  cervical traction - tolerated well, cups to thoracic paraspinals in seated position  Therapeutic Activity: Self Care: Trigger Point Dry Needling:  Modalities: thermotherapy to cervical spine during manual interventions.     PATIENT EDUCATION:  Education details: on HEP and rationale behind interventions  Person educated: Patient Education  method: Explanation, Demonstration, and Handouts Education comprehension: verbalized understanding   HOME EXERCISE PROGRAM: [id number] Body scan, self massage to neck/head/chest muscles  ASSESSMENT:  CLINICAL IMPRESSION:  07/24/2023  Difficult session today with increased light headedness and nausea. Symptoms intensified so removed heat and changed position. Tried cups, box breathing with minimal change in symptoms. Discussed trying cryotherapy at home. Will continue to try different symptom management strategies. Nausea was the same end of session and light headedness a little better. Patient upset end of session with return of symptoms, reassured patient and will continue with symptom management in future sessions.   Eval: Patient presents to physical therapy with complaints of neck and shoulder pain that started a while ago but recently increased in intensity of the last 3 weeks.  Patient presents with cervicogenic headache symptoms as well as increased tension throughout cervical musculature.  Educated patient on current  finding as well as benefits of physical therapy.  Patient with significant muscle guarding and pain with light palpation.  Encourage patient to perform gentle self massage to help down regulate central nervous system.  Patient would greatly benefit from skilled PT to address physical impairments and return her to optimal function.  OBJECTIVE IMPAIRMENTS: decreased activity tolerance, decreased mobility, decreased ROM, decreased strength, improper body mechanics, postural dysfunction, and pain.   ACTIVITY LIMITATIONS: carrying, sitting, sleeping, reach over head, and hygiene/grooming  PARTICIPATION LIMITATIONS: community activity and occupation  PERSONAL FACTORS: Fitness, Time since onset of injury/illness/exacerbation, and 1 comorbidity: dizziness are also affecting patient's functional outcome.   REHAB POTENTIAL: Good  CLINICAL DECISION MAKING:  Stable/uncomplicated  EVALUATION COMPLEXITY: Low   GOALS: Goals reviewed with patient? yes  SHORT TERM GOALS: Target date: 08/08/2023   Patient will be independent in self management strategies to improve quality of life and functional outcomes. Baseline: New Program Goal status: INITIAL  2.  Patient will report at least 50% improvement in overall symptoms and/or function to demonstrate improved functional mobility Baseline: 0% better Goal status: INITIAL  3.  Patient will be able to regularly wear cross body back without increase in symptoms Baseline: Painful Goal status: INITIAL    LONG TERM GOALS: Target date: 09/19/2023    Patient will report at least 75% improvement in overall symptoms and/or function to demonstrate improved functional mobility Baseline: 0% better Goal status: INITIAL  2.  Patient will score at least 2 points higher on PSFS average to demonstrate change in overall function. Baseline: see above Goal status: INITIAL  3.  Patient will be able to regularly workout without increase in symptoms returning to prior level of function Baseline: Unable Goal status: INITIAL     PLAN:  PT FREQUENCY: 1-2x/week for a total to 16 visits over 12 week certification period  PT DURATION: 12 weeks  PLANNED INTERVENTIONS: 97110-Therapeutic exercises, 97530- Therapeutic activity, 97112- Neuromuscular re-education, 97535- Self Care, 16109- Manual therapy, [phone]- Gait training, [phone]- Orthotic Fit/training, [phone]- Canalith repositioning, [id number]- Aquatic Therapy, 97014- Electrical stimulation (unattended), [phone]- Ionotophoresis 4mg /ml Dexamethasone , Patient/Family education, Balance training, Stair training, Taping, Dry Needling, Joint mobilization, Joint manipulation, Spinal manipulation, Spinal mobilization, Cryotherapy, and Moist heat   PLAN FOR NEXT SESSION: STM, DN if indicated/tolerated, f/u body scan and self massage, Ergonomic set up, thoracic mobility (towel roll,  chin tucks/neck ROM, serratus +, breathing exercises   9:30 AM, 07/24/23 Tabitha Ewings, DPT Physical Therapy with [facility]

## 2023-07-27 ENCOUNTER — Encounter: Payer: Self-pay | Admitting: Physical Therapy

## 2023-07-27 ENCOUNTER — Ambulatory Visit: Admitting: Physical Therapy

## 2023-07-27 DIAGNOSIS — M6281 Muscle weakness (generalized): Secondary | ICD-10-CM | POA: Diagnosis not present

## 2023-07-27 DIAGNOSIS — M542 Cervicalgia: Secondary | ICD-10-CM

## 2023-07-27 NOTE — Therapy (Signed)
 OUTPATIENT PHYSICAL THERAPY CERVICAL TREATMENT   Patient Name: Tracey Taylor MRN: [id number] DOB:09-19-1970, 53 y.o., female Today's Date: 07/27/2023  END OF SESSION:  PT End of Session - 07/27/23 1302     Visit Number 6    Number of Visits 16    Date for PT Re-Evaluation 09/19/23    Authorization Type U[employer]    PT Start Time 1304    PT Stop Time 1342    PT Time Calculation (min) 38 min    Activity Tolerance Patient limited by pain    Behavior During Therapy WFL for tasks assessed/performed          Past Medical History:  Diagnosis Date   Headache    Past Surgical History:  Procedure Laterality Date   CERVICAL BIOPSY  W/ LOOP ELECTRODE EXCISION N/A 02/06/2013   CYSTOSCOPY N/A 07/31/2019   Procedure: CYSTOSCOPY;  Surgeon: Matt Song, MD;  Location: [facility];  Service: Gynecology;  Laterality: N/A;   ECTOPIC PREGNANCY SURGERY N/A 1998   tube removed but does not remember which side   ROBOTIC ASSISTED LAPAROSCOPIC HYSTERECTOMY AND SALPINGECTOMY Bilateral 07/31/2019   Procedure: XI ROBOTIC ASSISTED LAPAROSCOPIC HYSTERECTOMY AND SALPINGECTOMY;  Surgeon: Matt Song, MD;  Location: [facility];  Service: Gynecology;  Laterality: Bilateral;   TUBAL LIGATION  2000   Patient Active Problem List   Diagnosis Date Noted   Allergic reaction of correct medicinal substance properly administered 07/12/2023   Intramural leiomyoma of uterus 04/04/2019   Pancreatic cyst 04/04/2019   Anemia 04/04/2019   Mixed stress and urge urinary incontinence 04/04/2019   Pain in left knee 07/25/2018   Tension headache 03/21/2016   Migraine variant, intractable 03/21/2016   Vertigo 03/21/2016    PCP: Alexander Iba, PA  REFERRING PROVIDER: Syliva Even, MD  REFERRING DIAG: M54.2 (ICD-10-CM) - Cervicalgia   THERAPY DIAG:  Cervicalgia  Muscle weakness (generalized)  Rationale for Evaluation and Treatment: Rehabilitation  ONSET DATE:  years, 3 weeks ago last episode  SUBJECTIVE:                                                                                                                                                                                                         SUBJECTIVE STATEMENT:  07/27/2023 States she feels like the way she is sitting is causing her lightheadedness. She has a laptop and a desktop. States her screen is elevated to eye level. States she sometimes has to look down to see the keys.    Eval: States she has had neck pain  for years. States it has gotten more intense in the middle a few weeks ago. States that three weeks ago she started having some dizziness as well.  States that her shoulders also bother her. States that her cross body bag also aggravates her. States she was having some pressure in her chest if she lays on her back or sitting up in certain positions.    Works form home and is sitting all day. Got new mattress, and neck pillow. States she is more comfortable in her new chair at work.  States she has not been able to wear a tight ponytail abrades for a while now because of pain she also has noticed some jaw symptoms and pressure in her ears.  Reports she still has some bouts of dizziness but does not report any current numbness or tingling in her hands.  Reports both sides are equally affected.  She was working out pretty regularly doing body weight stuff about 2 months ago but has not been able to do it since her symptoms increased.  Her muscle relaxer does help but she does not like to take medication and she has an initial consult with a neurologist on July 24.   Hand dominance: Right  PERTINENT HISTORY:  recent dizziness  PAIN:  Are you having pain? Yes: NPRS scale: 1/10 Pain location: center of neck  Pain description: intense, achy Aggravating factors: sitting for long periods of time, braids.   Relieving factors: medication, laying on side  PRECAUTIONS: None  RED  FLAGS: None     WEIGHT BEARING RESTRICTIONS: No  FALLS:  Has patient fallen in last 6 months? No    OCCUPATION: collections, works from home, moderate stress  PLOF: Independent  PATIENT GOALS: to have less pain   NEXT MD VISIT: neuro July 24th  OBJECTIVE:  Note: Objective measures were completed at Evaluation unless otherwise noted.  DIAGNOSTIC FINDINGS:  06/08/23 FINDINGS: On the lateral radiograph, C1-C7 are visualized. Mild straightening of the normal cervical lordosis. Vertebral body heights are well maintained without acute fracture. The lateral masses of C1 are well aligned with C2. The visualized odontoid is intact. Intervertebral disc heights are preserved. No significant neural foraminal stenosis. No prevertebral soft tissue swelling. No abnormal translation of the cervical spine with flexion.   IMPRESSION: Mild straightening of the normal cervical lordosis, which may be due to patient positioning or muscular spasm. Otherwise, no acute fracture or malalignment of the cervical spine.  PATIENT SURVEYS:  Patient-specific activity functional scoring scheme (Point to one number):  0 represents "unable to perform." 10 represents "able to perform at prior level. 0 1 2 3 4 5 6 7 8 9  10 (Date and Score) Activity Initial  Activity Eval     Wearing braids  0    Wearing cross body bag 5    Working out 0    Additional Additional Total score = sum of the activity scores/number of activities Minimum detectable change (90%CI) for average score = 2 points Minimum detectable change (90%CI) for single activity score = 3 points PSFS developed by: [place] Spitz., & Binkley, J. (1995). Assessing disability and change on individual  patients: a report of a patient specific measure. Physiotherapy [place], 47, [phone]. Reproduced with the permission of the authors  Score: 5/3= 1.66   COGNITION: Overall cognitive status: Within functional limits  for tasks assessed  SENSATION: Not tested  POSTURE: rounded shoulders, forward head, and flexed trunk   PALPATION: Increased resting tone  and pain with palpation along B cervical paraspinals, UT, pecs, suboccipitals, unable to assess joint mobility due to pain   CERVICAL ROM:   Active ROM A/PROM (deg) eval  Flexion WFL  Extension WFL*  Right lateral flexion   Left lateral flexion   Right rotation 75  Left rotation 65*   (Blank rows = not tested)    UE Measurements Upper Extremity Right EVAL Left EVAL   A/PROM MMT A/PROM MMT  Shoulder Flexion WFL 4 WFL 4  Shoulder Extension      Shoulder Abduction WFL 4  4-*  Shoulder Adduction      Shoulder Internal Rotation WFL 4+ WFL 4+  Shoulder External Rotation WFL 3+ WFL 3+  Elbow Flexion      Elbow Extension      Wrist Flexion      Wrist Extension      Wrist Supination      Wrist Pronation      Wrist Ulnar Deviation      Wrist Radial Deviation      Grip Strength NA  NA     (Blank rows = not tested)   * pain   TREATMENT DATE:                                                                                                                               07/27/2023  Therapeutic Exercise:  REVIEW of entire HEP, anatomy Supine:    S/l:  Prone:  Seated:  Standing: shoulder flexion at wall with slight ER activation 2 minutes- facing wall, shoulder flexion posterior support 2 minutes, shoulder ER with wall support 2 minutes, self massage with tennis ball at wall - 10 minutes Neuromuscular Re-education:  Manual Therapy:   Therapeutic Activity: Self Care: on possible migraine symptoms - on current presentation- on MD f/u about symptoms and triggers, on blue light glasses, on use of blue light glasses to reduce light sensitivity  Trigger Point Dry Needling:  Modalities: thermotherapy to cervical spine during manual interventions.     PATIENT EDUCATION:  Education details: on HEP on above in self care  Person educated:  Patient Education method: Explanation, Demonstration, and Handouts Education comprehension: verbalized understanding   HOME EXERCISE PROGRAM: [id number] Body scan, self massage to neck/head/chest muscles  ASSESSMENT:  CLINICAL IMPRESSION:  07/27/2023 Session focused on education and answering all questions. No manual interventions on this date secondary to increase in symptoms following last session. Encouraged MD f/u with recent increase in lightheadedness symptoms and brainstormed additional differential diagnosis with patient based on symptoms and triggers.   Eval: Patient presents to physical therapy with complaints of neck and shoulder pain that started a while ago but recently increased in intensity of the last 3 weeks.  Patient presents with cervicogenic headache symptoms as well as increased tension throughout cervical musculature.  Educated patient on current finding as well as benefits of physical therapy.  Patient with significant muscle guarding and pain with  light palpation.  Encourage patient to perform gentle self massage to help down regulate central nervous system.  Patient would greatly benefit from skilled PT to address physical impairments and return her to optimal function.  OBJECTIVE IMPAIRMENTS: decreased activity tolerance, decreased mobility, decreased ROM, decreased strength, improper body mechanics, postural dysfunction, and pain.   ACTIVITY LIMITATIONS: carrying, sitting, sleeping, reach over head, and hygiene/grooming  PARTICIPATION LIMITATIONS: community activity and occupation  PERSONAL FACTORS: Fitness, Time since onset of injury/illness/exacerbation, and 1 comorbidity: dizziness are also affecting patient's functional outcome.   REHAB POTENTIAL: Good  CLINICAL DECISION MAKING: Stable/uncomplicated  EVALUATION COMPLEXITY: Low   GOALS: Goals reviewed with patient? yes  SHORT TERM GOALS: Target date: 08/08/2023   Patient will be independent in self  management strategies to improve quality of life and functional outcomes. Baseline: New Program Goal status: INITIAL  2.  Patient will report at least 50% improvement in overall symptoms and/or function to demonstrate improved functional mobility Baseline: 0% better Goal status: INITIAL  3.  Patient will be able to regularly wear cross body back without increase in symptoms Baseline: Painful Goal status: INITIAL    LONG TERM GOALS: Target date: 09/19/2023    Patient will report at least 75% improvement in overall symptoms and/or function to demonstrate improved functional mobility Baseline: 0% better Goal status: INITIAL  2.  Patient will score at least 2 points higher on PSFS average to demonstrate change in overall function. Baseline: see above Goal status: INITIAL  3.  Patient will be able to regularly workout without increase in symptoms returning to prior level of function Baseline: Unable Goal status: INITIAL     PLAN:  PT FREQUENCY: 1-2x/week for a total to 16 visits over 12 week certification period  PT DURATION: 12 weeks  PLANNED INTERVENTIONS: 97110-Therapeutic exercises, 97530- Therapeutic activity, 97112- Neuromuscular re-education, 97535- Self Care, 16109- Manual therapy, [phone]- Gait training, [phone]- Orthotic Fit/training, [phone]- Canalith repositioning, [id number]- Aquatic Therapy, 97014- Electrical stimulation (unattended), [phone]- Ionotophoresis 4mg /ml Dexamethasone , Patient/Family education, Balance training, Stair training, Taping, Dry Needling, Joint mobilization, Joint manipulation, Spinal manipulation, Spinal mobilization, Cryotherapy, and Moist heat   PLAN FOR NEXT SESSION: STM, DN if indicated/tolerated, f/u body scan and self massage, Ergonomic set up, thoracic mobility (towel roll, chin tucks/neck ROM, serratus +, breathing exercises   1:46 PM, 07/27/23 Tabitha Ewings, DPT Physical Therapy with [facility]

## 2023-07-28 ENCOUNTER — Other Ambulatory Visit: Payer: Self-pay | Admitting: Neurology

## 2023-07-28 MED ORDER — TOPIRAMATE 25 MG PO TABS: 25.0000 mg | ORAL_TABLET | Freq: Two times a day (BID) | ORAL | 3 refills | Status: AC

## 2023-07-31 ENCOUNTER — Ambulatory Visit: Admitting: Physical Therapy

## 2023-07-31 ENCOUNTER — Encounter: Payer: Self-pay | Admitting: Physical Therapy

## 2023-07-31 DIAGNOSIS — M6281 Muscle weakness (generalized): Secondary | ICD-10-CM | POA: Diagnosis not present

## 2023-07-31 DIAGNOSIS — M542 Cervicalgia: Secondary | ICD-10-CM

## 2023-07-31 NOTE — Therapy (Signed)
 OUTPATIENT PHYSICAL THERAPY CERVICAL TREATMENT   Patient Name: Tracey Taylor MRN: [id number] DOB:July 28, 1970, 53 y.o., female Today's Date: 07/31/2023  END OF SESSION:  PT End of Session - 07/31/23 1213     Visit Number 7    Number of Visits 16    Date for PT Re-Evaluation 09/19/23    Authorization Type [employer]    PT Start Time 1215    PT Stop Time 1255    PT Time Calculation (min) 40 min    Activity Tolerance Patient limited by pain    Behavior During Therapy WFL for tasks assessed/performed          Past Medical History:  Diagnosis Date   Headache    Past Surgical History:  Procedure Laterality Date   CERVICAL BIOPSY  W/ LOOP ELECTRODE EXCISION N/A 02/06/2013   CYSTOSCOPY N/A 07/31/2019   Procedure: CYSTOSCOPY;  Surgeon: Sarrah Browning, MD;  Location: [facility] [place];  Service: Gynecology;  Laterality: N/A;   ECTOPIC PREGNANCY SURGERY N/A 1998   tube removed but does not remember which side   ROBOTIC ASSISTED LAPAROSCOPIC HYSTERECTOMY AND SALPINGECTOMY Bilateral 07/31/2019   Procedure: XI ROBOTIC ASSISTED LAPAROSCOPIC HYSTERECTOMY AND SALPINGECTOMY;  Surgeon: Sarrah Browning, MD;  Location: [facility];  Service: Gynecology;  Laterality: Bilateral;   TUBAL LIGATION  2000   Patient Active Problem List   Diagnosis Date Noted   Allergic reaction of correct medicinal substance properly administered 07/12/2023   Intramural leiomyoma of uterus 04/04/2019   Pancreatic cyst 04/04/2019   Anemia 04/04/2019   Mixed stress and urge urinary incontinence 04/04/2019   Pain in left knee 07/25/2018   Tension headache 03/21/2016   Migraine variant, intractable 03/21/2016   Vertigo 03/21/2016    PCP: Job Lukes, PA  REFERRING PROVIDER: Joane Artist RAMAN, MD  REFERRING DIAG: M54.2 (ICD-10-CM) - Cervicalgia   THERAPY DIAG:  Cervicalgia  Muscle weakness (generalized)  Rationale for Evaluation and Treatment: Rehabilitation  ONSET DATE:  years, 3 weeks ago last episode  SUBJECTIVE:                                                                                                                                                                                                         SUBJECTIVE STATEMENT:  07/31/2023 States she got the glasses on Friday and been using them and helping. Overall less symptoms. Pain is maybe 1/10.    Eval: States she has had neck pain for years. States it has gotten more intense in the middle a few weeks ago. States that three weeks  ago she started having some dizziness as well.  States that her shoulders also bother her. States that her cross body bag also aggravates her. States she was having some pressure in her chest if she lays on her back or sitting up in certain positions.    Works form home and is sitting all day. Got new mattress, and neck pillow. States she is more comfortable in her new chair at work.  States she has not been able to wear a tight ponytail abrades for a while now because of pain she also has noticed some jaw symptoms and pressure in her ears.  Reports she still has some bouts of dizziness but does not report any current numbness or tingling in her hands.  Reports both sides are equally affected.  She was working out pretty regularly doing body weight stuff about 2 months ago but has not been able to do it since her symptoms increased.  Her muscle relaxer does help but she does not like to take medication and she has an initial consult with a neurologist on July 24.   Hand dominance: Right  PERTINENT HISTORY:  recent dizziness  PAIN:  Are you having pain? Yes: NPRS scale: 1/10 Pain location: center of neck  Pain description: intense, achy Aggravating factors: sitting for long periods of time, braids.   Relieving factors: medication, laying on side  PRECAUTIONS: None  RED FLAGS: None     WEIGHT BEARING RESTRICTIONS: No  FALLS:  Has patient fallen in last 6 months?  No    OCCUPATION: collections, works from home, moderate stress  PLOF: Independent  PATIENT GOALS: to have less pain   NEXT MD VISIT: neuro July 24th  OBJECTIVE:  Note: Objective measures were completed at Evaluation unless otherwise noted.  DIAGNOSTIC FINDINGS:  06/08/23 FINDINGS: On the lateral radiograph, C1-C7 are visualized. Mild straightening of the normal cervical lordosis. Vertebral body heights are well maintained without acute fracture. The lateral masses of C1 are well aligned with C2. The visualized odontoid is intact. Intervertebral disc heights are preserved. No significant neural foraminal stenosis. No prevertebral soft tissue swelling. No abnormal translation of the cervical spine with flexion.   IMPRESSION: Mild straightening of the normal cervical lordosis, which may be due to patient positioning or muscular spasm. Otherwise, no acute fracture or malalignment of the cervical spine.  PATIENT SURVEYS:  Patient-specific activity functional scoring scheme (Point to one number):  0 represents "unable to perform." 10 represents "able to perform at prior level. 0 1 2 3 4 5 6 7 8 9  10 (Date and Score) Activity Initial  Activity Eval     Wearing braids  0    Wearing cross body bag 5    Working out 0    Additional Additional Total score = sum of the activity scores/number of activities Minimum detectable change (90%CI) for average score = 2 points Minimum detectable change (90%CI) for single activity score = 3 points PSFS developed by: Rosalee MYRTIS Marvis KYM Charlet CHRISTELLA., & Binkley, J. (1995). Assessing disability and change on individual  patients: a report of a patient specific measure. Physiotherapy [place], 47, [phone]. Reproduced with the permission of the authors  Score: 5/3= 1.66   COGNITION: Overall cognitive status: Within functional limits for tasks assessed  SENSATION: Not tested  POSTURE: rounded shoulders, forward head, and flexed  trunk   PALPATION: Increased resting tone and pain with palpation along B cervical paraspinals, UT, pecs, suboccipitals, unable to assess joint mobility due to pain  CERVICAL ROM:   Active ROM A/PROM (deg) eval  Flexion WFL  Extension WFL*  Right lateral flexion   Left lateral flexion   Right rotation 75  Left rotation 65*   (Blank rows = not tested)    UE Measurements Upper Extremity Right EVAL Left EVAL   A/PROM MMT A/PROM MMT  Shoulder Flexion WFL 4 WFL 4  Shoulder Extension      Shoulder Abduction WFL 4  4-*  Shoulder Adduction      Shoulder Internal Rotation WFL 4+ WFL 4+  Shoulder External Rotation WFL 3+ WFL 3+  Elbow Flexion      Elbow Extension      Wrist Flexion      Wrist Extension      Wrist Supination      Wrist Pronation      Wrist Ulnar Deviation      Wrist Radial Deviation      Grip Strength NA  NA     (Blank rows = not tested)   * pain   TREATMENT DATE:                                                                                                                               07/31/2023  Therapeutic Exercise:  REVIEW of entire HEP, anatomy, answered questions about prescribed medication and encouraged MD f/u with medication questions  Supine:    S/l:  Prone:  Seated:  Standing: chin tucks at wall with breath 1 minute x2 rounds, wall push ups x10--> counter pushups x10  Neuromuscular Re-education:  Manual Therapy:  STM to B cervical paraspinals, facial and head muscles, cervical traction - tolerated well  Therapeutic Activity: Self Care:   Trigger Point Dry Needling:  Modalities: thermotherapy to cervical spine during manual interventions.     PATIENT EDUCATION:  Education details: on HEP  Person educated: Patient Education method: Programmer, multimedia, Facilities manager, and Handouts Education comprehension: verbalized understanding   HOME EXERCISE PROGRAM: [id number] Body scan, self massage to neck/head/chest  muscles  ASSESSMENT:  CLINICAL IMPRESSION:  07/31/2023 Tolerated manual interventions well with less tension noted after. Added back in shoulder ROM exercises which were also tolerated well. No increase in pain noted end of session. Will continue with current POC as tolerated.   Eval: Patient presents to physical therapy with complaints of neck and shoulder pain that started a while ago but recently increased in intensity of the last 3 weeks.  Patient presents with cervicogenic headache symptoms as well as increased tension throughout cervical musculature.  Educated patient on current finding as well as benefits of physical therapy.  Patient with significant muscle guarding and pain with light palpation.  Encourage patient to perform gentle self massage to help down regulate central nervous system.  Patient would greatly benefit from skilled PT to address physical impairments and return her to optimal function.  OBJECTIVE IMPAIRMENTS: decreased activity tolerance, decreased mobility, decreased ROM, decreased strength, improper body mechanics, postural dysfunction,  and pain.   ACTIVITY LIMITATIONS: carrying, sitting, sleeping, reach over head, and hygiene/grooming  PARTICIPATION LIMITATIONS: community activity and occupation  PERSONAL FACTORS: Fitness, Time since onset of injury/illness/exacerbation, and 1 comorbidity: dizziness are also affecting patient's functional outcome.   REHAB POTENTIAL: Good  CLINICAL DECISION MAKING: Stable/uncomplicated  EVALUATION COMPLEXITY: Low   GOALS: Goals reviewed with patient? yes  SHORT TERM GOALS: Target date: 08/08/2023   Patient will be independent in self management strategies to improve quality of life and functional outcomes. Baseline: New Program Goal status: INITIAL  2.  Patient will report at least 50% improvement in overall symptoms and/or function to demonstrate improved functional mobility Baseline: 0% better Goal status: INITIAL  3.   Patient will be able to regularly wear cross body back without increase in symptoms Baseline: Painful Goal status: INITIAL    LONG TERM GOALS: Target date: 09/19/2023    Patient will report at least 75% improvement in overall symptoms and/or function to demonstrate improved functional mobility Baseline: 0% better Goal status: INITIAL  2.  Patient will score at least 2 points higher on PSFS average to demonstrate change in overall function. Baseline: see above Goal status: INITIAL  3.  Patient will be able to regularly workout without increase in symptoms returning to prior level of function Baseline: Unable Goal status: INITIAL     PLAN:  PT FREQUENCY: 1-2x/week for a total to 16 visits over 12 week certification period  PT DURATION: 12 weeks  PLANNED INTERVENTIONS: 97110-Therapeutic exercises, 97530- Therapeutic activity, 97112- Neuromuscular re-education, 97535- Self Care, 02859- Manual therapy, [phone]- Gait training, [phone]- Orthotic Fit/training, [phone]- Canalith repositioning, [id number]- Aquatic Therapy, 97014- Electrical stimulation (unattended), [phone]- Ionotophoresis 4mg /ml Dexamethasone , Patient/Family education, Balance training, Stair training, Taping, Dry Needling, Joint mobilization, Joint manipulation, Spinal manipulation, Spinal mobilization, Cryotherapy, and Moist heat   PLAN FOR NEXT SESSION: STM, DN if indicated/tolerated, f/u body scan and self massage, Ergonomic set up, thoracic mobility (towel roll, chin tucks/neck ROM, serratus +, breathing exercises   12:57 PM, 07/31/23 Olivia Church, DPT Physical Therapy with [place]

## 2023-08-03 ENCOUNTER — Ambulatory Visit: Admitting: Physical Therapy

## 2023-08-03 ENCOUNTER — Encounter: Payer: Self-pay | Admitting: Physical Therapy

## 2023-08-03 DIAGNOSIS — M6281 Muscle weakness (generalized): Secondary | ICD-10-CM

## 2023-08-03 DIAGNOSIS — M542 Cervicalgia: Secondary | ICD-10-CM | POA: Diagnosis not present

## 2023-08-03 NOTE — Therapy (Signed)
 OUTPATIENT PHYSICAL THERAPY CERVICAL TREATMENT Progress Note Reporting Period 06/27/23 to 08/03/23  See note below for Objective Data and Assessment of Progress/Goals.      Patient Name: Tracey Taylor MRN: [id number] DOB:01/03/1971, 53 y.o., female Today's Date: 08/03/2023  END OF SESSION:  PT End of Session - 08/03/23 0802     Visit Number 8    Number of Visits 16    Date for PT Re-Evaluation 09/19/23    Authorization Type [employer]    Progress Note Due on Visit 18    PT Start Time 0802    PT Stop Time 0842    PT Time Calculation (min) 40 min    Activity Tolerance Patient limited by pain    Behavior During Therapy [facility] for tasks assessed/performed          Past Medical History:  Diagnosis Date   Headache    Past Surgical History:  Procedure Laterality Date   CERVICAL BIOPSY  W/ LOOP ELECTRODE EXCISION N/A 02/06/2013   CYSTOSCOPY N/A 07/31/2019   Procedure: CYSTOSCOPY;  Surgeon: Sarrah Browning, MD;  Location: [facility] [employer];  Service: Gynecology;  Laterality: N/A;   ECTOPIC PREGNANCY SURGERY N/A 1998   tube removed but does not remember which side   ROBOTIC ASSISTED LAPAROSCOPIC HYSTERECTOMY AND SALPINGECTOMY Bilateral 07/31/2019   Procedure: XI ROBOTIC ASSISTED LAPAROSCOPIC HYSTERECTOMY AND SALPINGECTOMY;  Surgeon: Sarrah Browning, MD;  Location: [facility];  Service: Gynecology;  Laterality: Bilateral;   TUBAL LIGATION  2000   Patient Active Problem List   Diagnosis Date Noted   Allergic reaction of correct medicinal substance properly administered 07/12/2023   Intramural leiomyoma of uterus 04/04/2019   Pancreatic cyst 04/04/2019   Anemia 04/04/2019   Mixed stress and urge urinary incontinence 04/04/2019   Pain in left knee 07/25/2018   Tension headache 03/21/2016   Migraine variant, intractable 03/21/2016   Vertigo 03/21/2016    PCP: Job Lukes, PA  REFERRING PROVIDER: Joane Artist RAMAN, MD  REFERRING DIAG: M54.2  (ICD-10-CM) - Cervicalgia   THERAPY DIAG:  Cervicalgia  Muscle weakness (generalized)  Rationale for Evaluation and Treatment: Rehabilitation  ONSET DATE: years, 3 weeks ago last episode  SUBJECTIVE:                                                                                                                                                                                                         SUBJECTIVE STATEMENT:  08/03/2023 States she was slightly dizzy on Tuesday but she started the medication. States this morning she feels good. She  got on the treadmill the last couple of days. States she was a little dizzy yesterday putting up groceries turning her head back forth with her head. States she feels about 70% better since the start of PT.   Eval: States she has had neck pain for years. States it has gotten more intense in the middle a few weeks ago. States that three weeks ago she started having some dizziness as well.  States that her shoulders also bother her. States that her cross body bag also aggravates her. States she was having some pressure in her chest if she lays on her back or sitting up in certain positions.    Works form home and is sitting all day. Got new mattress, and neck pillow. States she is more comfortable in her new chair at work.  States she has not been able to wear a tight ponytail abrades for a while now because of pain she also has noticed some jaw symptoms and pressure in her ears.  Reports she still has some bouts of dizziness but does not report any current numbness or tingling in her hands.  Reports both sides are equally affected.  She was working out pretty regularly doing body weight stuff about 2 months ago but has not been able to do it since her symptoms increased.  Her muscle relaxer does help but she does not like to take medication and she has an initial consult with a neurologist on July 24.   Hand dominance: Right  PERTINENT HISTORY:  recent  dizziness  PAIN:  Are you having pain? Yes: NPRS scale: 0/10 Pain location: center of neck  Pain description:  achy Aggravating factors: sitting for long periods of time, braids.   Relieving factors: medication, laying on side  PRECAUTIONS: None  RED FLAGS: None     WEIGHT BEARING RESTRICTIONS: No  FALLS:  Has patient fallen in last 6 months? No    OCCUPATION: collections, works from home, moderate stress  PLOF: Independent  PATIENT GOALS: to have less pain   NEXT MD VISIT: neuro July 24th  OBJECTIVE:  Note: Objective measures were completed at Evaluation unless otherwise noted.  DIAGNOSTIC FINDINGS:  06/08/23 FINDINGS: On the lateral radiograph, C1-C7 are visualized. Mild straightening of the normal cervical lordosis. Vertebral body heights are well maintained without acute fracture. The lateral masses of C1 are well aligned with C2. The visualized odontoid is intact. Intervertebral disc heights are preserved. No significant neural foraminal stenosis. No prevertebral soft tissue swelling. No abnormal translation of the cervical spine with flexion.   IMPRESSION: Mild straightening of the normal cervical lordosis, which may be due to patient positioning or muscular spasm. Otherwise, no acute fracture or malalignment of the cervical spine.  PATIENT SURVEYS:  Patient-specific activity functional scoring scheme (Point to one number):  0 represents "unable to perform." 10 represents "able to perform at prior level. 0 1 2 3 4 5 6 7 8 9  10 (Date and Score) Activity Initial  Activity Eval   6/26  Wearing braids  0  0  Wearing cross body bag 5  5  Working out 0 10   Additional Additional Total score = sum of the activity scores/number of activities Minimum detectable change (90%CI) for average score = 2 points Minimum detectable change (90%CI) for single activity score = 3 points PSFS developed by: Rosalee MYRTIS Marvis KYM Charlet CHRISTELLA., & Binkley, J. (1995).  Assessing disability and change on individual  patients: a report of a patient specific  measure. Physiotherapy [place], 47, [phone]. Reproduced with the permission of the authors  Score: EVAL: 5/3= 1.66; 6/26: 15/3 =5   COGNITION: Overall cognitive status: Within functional limits for tasks assessed  SENSATION: Not tested  POSTURE: rounded shoulders, forward head, and flexed trunk   PALPATION: Increased resting tone and pain with palpation along B cervical paraspinals, UT, pecs, suboccipitals, unable to assess joint mobility due to pain   CERVICAL ROM:   Active ROM A/PROM (deg) 6/26  Flexion WFL*  Extension WFL*  Right lateral flexion   Left lateral flexion   Right rotation 75*  Left rotation 75*   (Blank rows = not tested) *pain    UE Measurements Upper Extremity Right 6/26 Left 6/26   A/PROM MMT A/PROM MMT  Shoulder Flexion WFL 4+ WFL 4+  Shoulder Extension      Shoulder Abduction WFL 4+  4+  Shoulder Adduction      Shoulder Internal Rotation WFL 4+ WFL 4+  Shoulder External Rotation WFL 4+ WFL 4+  Elbow Flexion      Elbow Extension      Wrist Flexion      Wrist Extension      Wrist Supination      Wrist Pronation      Wrist Ulnar Deviation      Wrist Radial Deviation      Grip Strength NA  NA     (Blank rows = not tested)   * pain   TREATMENT DATE:                                                                                                                               08/03/2023  Therapeutic Exercise:  Objective measurements updated.  Goals reviewed with pt Supine:    S/l:  Prone:  Seated: shoulder ER RTB 2x15 5 holds, shoulder horizontal abd 2x15 5 Holds RTB   Standing: c  Neuromuscular Re-education: on posture/position- tends to lean head to right - on use of mirror at desk for visual feedback. Seated chin tucks without support but verbal feedback x20 5 hlds Manual Therapy:     Therapeutic Activity: Self Care:   Trigger Point Dry  Needling:  Modalities:     PATIENT EDUCATION:  Education details: on HEP  Person educated: Patient Education method: Programmer, multimedia, Demonstration, and Handouts Education comprehension: verbalized understanding   HOME EXERCISE PROGRAM: [id number] Body scan, self massage to neck/head/chest muscles  ASSESSMENT:  CLINICAL IMPRESSION:  08/03/2023 Overall patient is doing well and is progressing towards goals.1 short and 1 long term goal met. Able to add strengthening exercises and discussed paitent's tendency to hold head in left sidebending. Discussed using mirror at desk for visual feedback to help correct for this. No pain note during or after session.  Eval: Patient presents to physical therapy with complaints of neck and shoulder pain that started a while ago but recently increased in intensity of the last 3 weeks.  Patient presents  with cervicogenic headache symptoms as well as increased tension throughout cervical musculature.  Educated patient on current finding as well as benefits of physical therapy.  Patient with significant muscle guarding and pain with light palpation.  Encourage patient to perform gentle self massage to help down regulate central nervous system.  Patient would greatly benefit from skilled PT to address physical impairments and return her to optimal function.  OBJECTIVE IMPAIRMENTS: decreased activity tolerance, decreased mobility, decreased ROM, decreased strength, improper body mechanics, postural dysfunction, and pain.   ACTIVITY LIMITATIONS: carrying, sitting, sleeping, reach over head, and hygiene/grooming  PARTICIPATION LIMITATIONS: community activity and occupation  PERSONAL FACTORS: Fitness, Time since onset of injury/illness/exacerbation, and 1 comorbidity: dizziness are also affecting patient's functional outcome.   REHAB POTENTIAL: Good  CLINICAL DECISION MAKING: Stable/uncomplicated  EVALUATION COMPLEXITY: Low   GOALS: Goals reviewed with  patient? yes  SHORT TERM GOALS: Target date: 08/08/2023   Patient will be independent in self management strategies to improve quality of life and functional outcomes. Baseline: New Program Goal status: PROGRESSING  2.  Patient will report at least 50% improvement in overall symptoms and/or function to demonstrate improved functional mobility Baseline: 0% better Goal status: MET  3.  Patient will be able to regularly wear cross body back without increase in symptoms Baseline: Painful Goal status: PROGRESSING    LONG TERM GOALS: Target date: 09/19/2023    Patient will report at least 75% improvement in overall symptoms and/or function to demonstrate improved functional mobility Baseline: 0% better Goal status: PROGRESSING  2.  Patient will score at least 2 points higher on PSFS average to demonstrate change in overall function. Baseline: see above Goal status: MET  3.  Patient will be able to regularly workout without increase in symptoms returning to prior level of function Baseline: Unable Goal status: PROGRESSING     PLAN:  PT FREQUENCY: 1-2x/week for a total to 16 visits over 12 week certification period  PT DURATION: 12 weeks  PLANNED INTERVENTIONS: 97110-Therapeutic exercises, 97530- Therapeutic activity, 97112- Neuromuscular re-education, 97535- Self Care, 02859- Manual therapy, [id number]- Gait training, [phone]- Orthotic Fit/training, [phone]- Canalith repositioning, [id number]- Aquatic Therapy, 97014- Electrical stimulation (unattended), [phone]- Ionotophoresis 4mg /ml Dexamethasone , Patient/Family education, Balance training, Stair training, Taping, Dry Needling, Joint mobilization, Joint manipulation, Spinal manipulation, Spinal mobilization, Cryotherapy, and Moist heat   PLAN FOR NEXT SESSION: cervical STM, DN if indicated/tolerated,migraine management (responded well to blue light glasses), posture/strengthening exercises, continued improved posture/ergonomic set up at work.     8:44 AM, 08/03/23 Olivia Church, DPT Physical Therapy with [place]

## 2023-08-08 ENCOUNTER — Ambulatory Visit: Admitting: Physical Therapy

## 2023-08-08 ENCOUNTER — Encounter: Payer: Self-pay | Admitting: Physical Therapy

## 2023-08-08 DIAGNOSIS — M542 Cervicalgia: Secondary | ICD-10-CM

## 2023-08-08 DIAGNOSIS — M6281 Muscle weakness (generalized): Secondary | ICD-10-CM

## 2023-08-08 NOTE — Therapy (Signed)
 OUTPATIENT PHYSICAL THERAPY CERVICAL TREATMENT       Patient Name: Tracey Taylor MRN: [id number] DOB:[birth year], 53 y.o., female Today's Date: 08/08/2023  END OF SESSION:  PT End of Session - 08/08/23 1350     Visit Number 9    Number of Visits 16    Date for PT Re-Evaluation 09/19/23    Authorization Type [employer]    Progress Note Due on Visit 18    PT Start Time 1350    PT Stop Time 1428    PT Time Calculation (min) 38 min    Activity Tolerance Patient limited by pain    Behavior During Therapy WFL for tasks assessed/performed          Past Medical History:  Diagnosis Date   Headache    Past Surgical History:  Procedure Laterality Date   CERVICAL BIOPSY  W/ LOOP ELECTRODE EXCISION N/A 02/06/2013   CYSTOSCOPY N/A 07/31/2019   Procedure: CYSTOSCOPY;  Surgeon: Sarrah Browning, MD;  Location: [facility];  Service: Gynecology;  Laterality: N/A;   ECTOPIC PREGNANCY SURGERY N/A 1998   tube removed but does not remember which side   ROBOTIC ASSISTED LAPAROSCOPIC HYSTERECTOMY AND SALPINGECTOMY Bilateral 07/31/2019   Procedure: XI ROBOTIC ASSISTED LAPAROSCOPIC HYSTERECTOMY AND SALPINGECTOMY;  Surgeon: Sarrah Browning, MD;  Location: [facility];  Service: Gynecology;  Laterality: Bilateral;   TUBAL LIGATION  2000   Patient Active Problem List   Diagnosis Date Noted   Allergic reaction of correct medicinal substance properly administered 07/12/2023   Intramural leiomyoma of uterus 04/04/2019   Pancreatic cyst 04/04/2019   Anemia 04/04/2019   Mixed stress and urge urinary incontinence 04/04/2019   Pain in left knee 07/25/2018   Tension headache 03/21/2016   Migraine variant, intractable 03/21/2016   Vertigo 03/21/2016    PCP: Job Lukes, PA  REFERRING PROVIDER: Joane Artist RAMAN, MD  REFERRING DIAG: M54.2 (ICD-10-CM) - Cervicalgia   THERAPY DIAG:  Cervicalgia  Muscle weakness (generalized)  Rationale for Evaluation and  Treatment: Rehabilitation  ONSET DATE: years, 3 weeks ago last episode  SUBJECTIVE:                                                                                                                                                                                                         SUBJECTIVE STATEMENT:  08/08/2023 States she is feeling really good. States she did have one day she had slight dizziness for a brief moment but it resolved. Been on the treadmill everyday. States she wants to get back [place]. States  she started back with her massages and it felt good. States she is doing good.   Eval: States she has had neck pain for years. States it has gotten more intense in the middle a few weeks ago. States that three weeks ago she started having some dizziness as well.  States that her shoulders also bother her. States that her cross body bag also aggravates her. States she was having some pressure in her chest if she lays on her back or sitting up in certain positions.    Works form home and is sitting all day. Got new mattress, and neck pillow. States she is more comfortable in her new chair at work.  States she has not been able to wear a tight ponytail abrades for a while now because of pain she also has noticed some jaw symptoms and pressure in her ears.  Reports she still has some bouts of dizziness but does not report any current numbness or tingling in her hands.  Reports both sides are equally affected.  She was working out pretty regularly doing body weight stuff about 2 months ago but has not been able to do it since her symptoms increased.  Her muscle relaxer does help but she does not like to take medication and she has an initial consult with a neurologist on July 24.   Hand dominance: Right  PERTINENT HISTORY:  recent dizziness  PAIN:  Are you having pain? Yes: NPRS scale: 0/10 Pain location: center of neck  Pain description:  achy Aggravating factors: sitting for long  periods of time, braids.   Relieving factors: medication, laying on side  PRECAUTIONS: None  RED FLAGS: None     WEIGHT BEARING RESTRICTIONS: No  FALLS:  Has patient fallen in last 6 months? No    OCCUPATION: collections, works from home, moderate stress  PLOF: Independent  PATIENT GOALS: to have less pain   NEXT MD VISIT: neuro July 24th  OBJECTIVE:  Note: Objective measures were completed at Evaluation unless otherwise noted.  DIAGNOSTIC FINDINGS:  06/08/23 FINDINGS: On the lateral radiograph, C1-C7 are visualized. Mild straightening of the normal cervical lordosis. Vertebral body heights are well maintained without acute fracture. The lateral masses of C1 are well aligned with C2. The visualized odontoid is intact. Intervertebral disc heights are preserved. No significant neural foraminal stenosis. No prevertebral soft tissue swelling. No abnormal translation of the cervical spine with flexion.   IMPRESSION: Mild straightening of the normal cervical lordosis, which may be due to patient positioning or muscular spasm. Otherwise, no acute fracture or malalignment of the cervical spine.  PATIENT SURVEYS:  Patient-specific activity functional scoring scheme (Point to one number):  0 represents "unable to perform." 10 represents "able to perform at prior level. 0 1 2 3 4 5 6 7 8 9  10 (Date and Score) Activity Initial  Activity Eval   6/26  Wearing braids  0  0  Wearing cross body bag 5  5  Working out 0 10   Additional Additional Total score = sum of the activity scores/number of activities Minimum detectable change (90%CI) for average score = 2 points Minimum detectable change (90%CI) for single activity score = 3 points PSFS developed by: Rosalee MYRTIS Marvis KYM Charlet CHRISTELLA., & Binkley, J. (1995). Assessing disability and change on individual  patients: a report of a patient specific measure. Physiotherapy [place], 47, [phone]. Reproduced with the  permission of the authors  Score: EVAL: 5/3= 1.66; 6/26: 15/3 =5   COGNITION:  Overall cognitive status: Within functional limits for tasks assessed  SENSATION: Not tested  POSTURE: rounded shoulders, forward head, and flexed trunk   PALPATION: Increased resting tone and pain with palpation along B cervical paraspinals, UT, pecs, suboccipitals, unable to assess joint mobility due to pain   CERVICAL ROM:   Active ROM A/PROM (deg) 6/26  Flexion WFL*  Extension WFL*  Right lateral flexion   Left lateral flexion   Right rotation 75*  Left rotation 75*   (Blank rows = not tested) *pain    UE Measurements Upper Extremity Right 6/26 Left 6/26   A/PROM MMT A/PROM MMT  Shoulder Flexion WFL 4+ WFL 4+  Shoulder Extension      Shoulder Abduction WFL 4+  4+  Shoulder Adduction      Shoulder Internal Rotation WFL 4+ WFL 4+  Shoulder External Rotation WFL 4+ WFL 4+  Elbow Flexion      Elbow Extension      Wrist Flexion      Wrist Extension      Wrist Supination      Wrist Pronation      Wrist Ulnar Deviation      Wrist Radial Deviation      Grip Strength NA  NA     (Blank rows = not tested)   * pain   TREATMENT DATE:                                                                                                                               08/08/2023  Therapeutic Exercise:    Supine:    S/l:  Prone:  Seated:    Standing: field goal exercises facing wall with red band 3x5 5 holds, wall star 3x5 Neuromuscular Re-education:   Manual Therapy:     Therapeutic Activity: STM to cervical and facial  musculature and suboccipitals - tolerated well  Self Care:   Trigger Point Dry Needling:  Modalities:     PATIENT EDUCATION:  Education details: on HEP  Person educated: Patient Education method: Programmer, multimedia, Facilities manager, and Handouts Education comprehension: verbalized understanding   HOME EXERCISE PROGRAM: [id number] Body scan, self massage to neck/head/chest  muscles  ASSESSMENT:  CLINICAL IMPRESSION:  08/08/2023 Session focused on shoulder strengthening exercises. Difficulty to perform some exercises so only wall star was added to HEP. No pain noted but overall patient doing well. Will continue with current POC as tolerated.   Eval: Patient presents to physical therapy with complaints of neck and shoulder pain that started a while ago but recently increased in intensity of the last 3 weeks.  Patient presents with cervicogenic headache symptoms as well as increased tension throughout cervical musculature.  Educated patient on current finding as well as benefits of physical therapy.  Patient with significant muscle guarding and pain with light palpation.  Encourage patient to perform gentle self massage to help down regulate central nervous system.  Patient would greatly benefit from skilled PT to  address physical impairments and return her to optimal function.  OBJECTIVE IMPAIRMENTS: decreased activity tolerance, decreased mobility, decreased ROM, decreased strength, improper body mechanics, postural dysfunction, and pain.   ACTIVITY LIMITATIONS: carrying, sitting, sleeping, reach over head, and hygiene/grooming  PARTICIPATION LIMITATIONS: community activity and occupation  PERSONAL FACTORS: Fitness, Time since onset of injury/illness/exacerbation, and 1 comorbidity: dizziness are also affecting patient's functional outcome.   REHAB POTENTIAL: Good  CLINICAL DECISION MAKING: Stable/uncomplicated  EVALUATION COMPLEXITY: Low   GOALS: Goals reviewed with patient? yes  SHORT TERM GOALS: Target date: 08/08/2023   Patient will be independent in self management strategies to improve quality of life and functional outcomes. Baseline: New Program Goal status: PROGRESSING  2.  Patient will report at least 50% improvement in overall symptoms and/or function to demonstrate improved functional mobility Baseline: 0% better Goal status: MET  3.  Patient  will be able to regularly wear cross body back without increase in symptoms Baseline: Painful Goal status: PROGRESSING    LONG TERM GOALS: Target date: 09/19/2023    Patient will report at least 75% improvement in overall symptoms and/or function to demonstrate improved functional mobility Baseline: 0% better Goal status: PROGRESSING  2.  Patient will score at least 2 points higher on PSFS average to demonstrate change in overall function. Baseline: see above Goal status: MET  3.  Patient will be able to regularly workout without increase in symptoms returning to prior level of function Baseline: Unable Goal status: PROGRESSING     PLAN:  PT FREQUENCY: 1-2x/week for a total to 16 visits over 12 week certification period  PT DURATION: 12 weeks  PLANNED INTERVENTIONS: 97110-Therapeutic exercises, 97530- Therapeutic activity, 97112- Neuromuscular re-education, 97535- Self Care, 02859- Manual therapy, [phone]- Gait training, [phone]- Orthotic Fit/training, [phone]- Canalith repositioning, [id number]- Aquatic Therapy, 97014- Electrical stimulation (unattended), [phone]- Ionotophoresis 4mg /ml Dexamethasone , Patient/Family education, Balance training, Stair training, Taping, Dry Needling, Joint mobilization, Joint manipulation, Spinal manipulation, Spinal mobilization, Cryotherapy, and Moist heat   PLAN FOR NEXT SESSION: cervical STM, DN if indicated/tolerated,migraine management (responded well to blue light glasses), posture/strengthening exercises, continued improved posture/ergonomic set up at work.    2:29 PM, 08/08/23 Olivia Church, DPT Physical Therapy with [facility]

## 2023-08-15 ENCOUNTER — Encounter: Admitting: Physical Therapy

## 2023-08-22 ENCOUNTER — Ambulatory Visit: Attending: Neurology

## 2023-08-22 ENCOUNTER — Ambulatory Visit: Payer: Self-pay | Admitting: Neurology

## 2023-08-22 DIAGNOSIS — M542 Cervicalgia: Secondary | ICD-10-CM | POA: Diagnosis not present

## 2023-08-22 DIAGNOSIS — R42 Dizziness and giddiness: Secondary | ICD-10-CM | POA: Diagnosis not present

## 2023-08-24 ENCOUNTER — Encounter: Payer: Self-pay | Admitting: Physical Therapy

## 2023-08-24 ENCOUNTER — Ambulatory Visit: Admitting: Physical Therapy

## 2023-08-24 DIAGNOSIS — M542 Cervicalgia: Secondary | ICD-10-CM

## 2023-08-24 DIAGNOSIS — M6281 Muscle weakness (generalized): Secondary | ICD-10-CM | POA: Diagnosis not present

## 2023-08-24 NOTE — Therapy (Signed)
 OUTPATIENT PHYSICAL THERAPY CERVICAL TREATMENT   Patient Name: Tracey Taylor MRN: [id number] DOB:12/24/1970, 53 y.o., female Today's Date: 08/24/2023  END OF SESSION:  PT End of Session - 08/24/23 0802     Visit Number 10    Number of Visits 16    Date for PT Re-Evaluation 09/19/23    Authorization Type [employer]    Progress Note Due on Visit 18    PT Start Time 0802    PT Stop Time 0840    PT Time Calculation (min) 38 min    Activity Tolerance Patient limited by pain    Behavior During Therapy WFL for tasks assessed/performed           Past Medical History:  Diagnosis Date   Headache    Past Surgical History:  Procedure Laterality Date   CERVICAL BIOPSY  W/ LOOP ELECTRODE EXCISION N/A 02/06/2013   CYSTOSCOPY N/A 07/31/2019   Procedure: CYSTOSCOPY;  Surgeon: Sarrah Browning, MD;  Location: [facility] [place];  Service: Gynecology;  Laterality: N/A;   ECTOPIC PREGNANCY SURGERY N/A 1998   tube removed but does not remember which side   ROBOTIC ASSISTED LAPAROSCOPIC HYSTERECTOMY AND SALPINGECTOMY Bilateral 07/31/2019   Procedure: XI ROBOTIC ASSISTED LAPAROSCOPIC HYSTERECTOMY AND SALPINGECTOMY;  Surgeon: Sarrah Browning, MD;  Location: [facility];  Service: Gynecology;  Laterality: Bilateral;   TUBAL LIGATION  2000   Patient Active Problem List   Diagnosis Date Noted   Allergic reaction of correct medicinal substance properly administered 07/12/2023   Intramural leiomyoma of uterus 04/04/2019   Pancreatic cyst 04/04/2019   Anemia 04/04/2019   Mixed stress and urge urinary incontinence 04/04/2019   Pain in left knee 07/25/2018   Tension headache 03/21/2016   Migraine variant, intractable 03/21/2016   Vertigo 03/21/2016    PCP: Job Lukes, PA  REFERRING PROVIDER: Joane Artist RAMAN, MD  REFERRING DIAG: M54.2 (ICD-10-CM) - Cervicalgia   THERAPY DIAG:  Cervicalgia  Muscle weakness (generalized)  Rationale for Evaluation and  Treatment: Rehabilitation  ONSET DATE: years, 3 weeks ago last episode  SUBJECTIVE:                                                                                                                                                                                                         SUBJECTIVE STATEMENT:  08/24/2023 Pt states neck has been doing pretty good. Santina out of town and was skeptical and was okay. Felt a little bit while she was working. Started to go back to the gym and doing cardio.  Eval: States she has had neck pain for years. States it has gotten more intense in the middle a few weeks ago. States that three weeks ago she started having some dizziness as well.  States that her shoulders also bother her. States that her cross body bag also aggravates her. States she was having some pressure in her chest if she lays on her back or sitting up in certain positions.    Works form home and is sitting all day. Got new mattress, and neck pillow. States she is more comfortable in her new chair at work.  States she has not been able to wear a tight ponytail abrades for a while now because of pain she also has noticed some jaw symptoms and pressure in her ears.  Reports she still has some bouts of dizziness but does not report any current numbness or tingling in her hands.  Reports both sides are equally affected.  She was working out pretty regularly doing body weight stuff about 2 months ago but has not been able to do it since her symptoms increased.  Her muscle relaxer does help but she does not like to take medication and she has an initial consult with a neurologist on July 24.   Hand dominance: Right  PERTINENT HISTORY:  recent dizziness  PAIN:  Are you having pain? Yes: NPRS scale: 0/10 Pain location: center of neck  Pain description:  achy Aggravating factors: sitting for long periods of time, braids.   Relieving factors: medication, laying on side  PRECAUTIONS: None  RED  FLAGS: None     WEIGHT BEARING RESTRICTIONS: No  FALLS:  Has patient fallen in last 6 months? No    OCCUPATION: collections, works from home, moderate stress  PATIENT GOALS: to have less pain   NEXT MD VISIT: neuro July 24th  OBJECTIVE:  Note: Objective measures were completed at Evaluation unless otherwise noted.  DIAGNOSTIC FINDINGS:  06/08/23 FINDINGS: On the lateral radiograph, C1-C7 are visualized. Mild straightening of the normal cervical lordosis. Vertebral body heights are well maintained without acute fracture. The lateral masses of C1 are well aligned with C2. The visualized odontoid is intact. Intervertebral disc heights are preserved. No significant neural foraminal stenosis. No prevertebral soft tissue swelling. No abnormal translation of the cervical spine with flexion.   IMPRESSION: Mild straightening of the normal cervical lordosis, which may be due to patient positioning or muscular spasm. Otherwise, no acute fracture or malalignment of the cervical spine.  PATIENT SURVEYS:  Patient-specific activity functional scoring scheme (Point to one number):  0 represents "unable to perform." 10 represents "able to perform at prior level. 0 1 2 3 4 5 6 7 8 9  10 (Date and Score) Activity Initial  Activity Eval   6/26  Wearing braids  0  0  Wearing cross body bag 5  5  Working out 0 10   Additional Additional Total score = sum of the activity scores/number of activities Minimum detectable change (90%CI) for average score = 2 points Minimum detectable change (90%CI) for single activity score = 3 points PSFS developed by: Rosalee MYRTIS Marvis KYM Charlet CHRISTELLA., & Binkley, J. (1995). Assessing disability and change on individual  patients: a report of a patient specific measure. Physiotherapy [place], 47, [phone]. Reproduced with the permission of the authors  Score: EVAL: 5/3= 1.66; 6/26: 15/3 =5  POSTURE: rounded shoulders, forward head, and flexed trunk    PALPATION: Increased resting tone and pain with palpation along B cervical paraspinals,  UT, pecs, suboccipitals, unable to assess joint mobility due to pain   CERVICAL ROM:   Active ROM A/PROM (deg) 6/26  Flexion WFL*  Extension WFL*  Right lateral flexion   Left lateral flexion   Right rotation 75*  Left rotation 75*   (Blank rows = not tested) *pain    UE Measurements Upper Extremity Right 6/26 Left 6/26   A/PROM MMT A/PROM MMT  Shoulder Flexion WFL 4+ WFL 4+  Shoulder Extension      Shoulder Abduction WFL 4+  4+  Shoulder Adduction      Shoulder Internal Rotation WFL 4+ WFL 4+  Shoulder External Rotation WFL 4+ WFL 4+  Elbow Flexion      Elbow Extension      Wrist Flexion      Wrist Extension      Wrist Supination      Wrist Pronation      Wrist Ulnar Deviation      Wrist Radial Deviation      Grip Strength NA  NA     (Blank rows = not tested)   * pain   TREATMENT DATE:                                                                                                                               08/24/2023 Therapeutic Exercise: Supine:   S/L:  Prone: Seated:   Standing:  UBE L2; 2 min fwd, 2 min bwd Neuromuscular Re-education:   Manual Therapy:    STM UT, cervical paraspinals, suboccipitals Therapeutic Activity:  Row green TB 2x10 Shoulder ext green TB 2x10 Shoulder horizontal abd green TB 2x10  Lat pull down green TB 2x10 Self Care:   Trigger Point Dry Needling:  Modalities:     PATIENT EDUCATION:  Education details: on HEP  Person educated: Patient Education method: Programmer, multimedia, Facilities manager, and Handouts Education comprehension: verbalized understanding   HOME EXERCISE PROGRAM: [id number] Body scan, self massage to neck/head/chest muscles  ASSESSMENT:  CLINICAL IMPRESSION:  08/24/2023 Ms. Tomicka continues to progress well -- no recurrence of pain or issues while driving to [place] . Discussed slow return to gym strengthening using  machines. Reviewed different machines in the gym and replicated proper form in clinic today. Pt able to demo good understanding. At this point, goal will be to slowly return to her normal activities and monitor neck symptoms and then adjust/problem solve as needed if pain returns.   Eval: Patient presents to physical therapy with complaints of neck and shoulder pain that started a while ago but recently increased in intensity of the last 3 weeks.  Patient presents with cervicogenic headache symptoms as well as increased tension throughout cervical musculature.  Educated patient on current finding as well as benefits of physical therapy.  Patient with significant muscle guarding and pain with light palpation.  Encourage patient to perform gentle self massage to help down regulate central nervous system.  Patient would greatly benefit from skilled  PT to address physical impairments and return her to optimal function.  OBJECTIVE IMPAIRMENTS: decreased activity tolerance, decreased mobility, decreased ROM, decreased strength, improper body mechanics, postural dysfunction, and pain.     GOALS: Goals reviewed with patient? yes  SHORT TERM GOALS: Target date: 08/08/2023   Patient will be independent in self management strategies to improve quality of life and functional outcomes. Baseline: New Program Goal status: PROGRESSING  2.  Patient will report at least 50% improvement in overall symptoms and/or function to demonstrate improved functional mobility Baseline: 0% better Goal status: MET  3.  Patient will be able to regularly wear cross body back without increase in symptoms Baseline: Painful Goal status: PROGRESSING    LONG TERM GOALS: Target date: 09/19/2023    Patient will report at least 75% improvement in overall symptoms and/or function to demonstrate improved functional mobility Baseline: 0% better Goal status: PROGRESSING  2.  Patient will score at least 2 points higher on PSFS  average to demonstrate change in overall function. Baseline: see above Goal status: MET  3.  Patient will be able to regularly workout without increase in symptoms returning to prior level of function Baseline: Unable Goal status: PROGRESSING     PLAN:  PT FREQUENCY: 1-2x/week for a total to 16 visits over 12 week certification period  PT DURATION: 12 weeks  PLANNED INTERVENTIONS: 97110-Therapeutic exercises, 97530- Therapeutic activity, 97112- Neuromuscular re-education, 97535- Self Care, 02859- Manual therapy, [phone]- Gait training, [phone]- Orthotic Fit/training, [phone]- Canalith repositioning, [id number]- Aquatic Therapy, 97014- Electrical stimulation (unattended), [phone]- Ionotophoresis 4mg /ml Dexamethasone , Patient/Family education, Balance training, Stair training, Taping, Dry Needling, Joint mobilization, Joint manipulation, Spinal manipulation, Spinal mobilization, Cryotherapy, and Moist heat   PLAN FOR NEXT SESSION: cervical STM, DN if indicated/tolerated,migraine management (responded well to blue light glasses), posture/strengthening exercises, continued improved posture/ergonomic set up at work.    8:02 AM, 08/24/23 Fallen Crisostomo April Ma L Philis Doke, PT, DPT Physical Therapy with [place]

## 2023-08-31 ENCOUNTER — Encounter: Admitting: Physical Therapy

## 2023-08-31 ENCOUNTER — Ambulatory Visit: Payer: Self-pay | Admitting: Neurology

## 2023-08-31 NOTE — Therapy (Deleted)
 OUTPATIENT PHYSICAL THERAPY CERVICAL TREATMENT   Patient Name: Tracey Taylor MRN: [id number] DOB:September 30, 1970, 53 y.o., female Today's Date: 08/31/2023  END OF SESSION:     Past Medical History:  Diagnosis Date   Headache    Past Surgical History:  Procedure Laterality Date   CERVICAL BIOPSY  W/ LOOP ELECTRODE EXCISION N/A 02/06/2013   CYSTOSCOPY N/A 07/31/2019   Procedure: CYSTOSCOPY;  Surgeon: Sarrah Browning, MD;  Location: [facility];  Service: Gynecology;  Laterality: N/A;   ECTOPIC PREGNANCY SURGERY N/A 1998   tube removed but does not remember which side   ROBOTIC ASSISTED LAPAROSCOPIC HYSTERECTOMY AND SALPINGECTOMY Bilateral 07/31/2019   Procedure: XI ROBOTIC ASSISTED LAPAROSCOPIC HYSTERECTOMY AND SALPINGECTOMY;  Surgeon: Sarrah Browning, MD;  Location: [facility];  Service: Gynecology;  Laterality: Bilateral;   TUBAL LIGATION  2000   Patient Active Problem List   Diagnosis Date Noted   Allergic reaction of correct medicinal substance properly administered 07/12/2023   Intramural leiomyoma of uterus 04/04/2019   Pancreatic cyst 04/04/2019   Anemia 04/04/2019   Mixed stress and urge urinary incontinence 04/04/2019   Pain in left knee 07/25/2018   Tension headache 03/21/2016   Migraine variant, intractable 03/21/2016   Vertigo 03/21/2016    PCP: Job Lukes, PA  REFERRING PROVIDER: Joane Artist RAMAN, MD  REFERRING DIAG: M54.2 (ICD-10-CM) - Cervicalgia   THERAPY DIAG:  No diagnosis found.  Rationale for Evaluation and Treatment: Rehabilitation  ONSET DATE: years, 3 weeks ago last episode  SUBJECTIVE:                                                                                                                                                                                                         SUBJECTIVE STATEMENT:  08/31/2023 *** Pt states neck has been doing pretty good. Santina out of town and was skeptical and  was okay. Felt a little bit while she was working. Started to go back to the gym and doing cardio.    Eval: States she has had neck pain for years. States it has gotten more intense in the middle a few weeks ago. States that three weeks ago she started having some dizziness as well.  States that her shoulders also bother her. States that her cross body bag also aggravates her. States she was having some pressure in her chest if she lays on her back or sitting up in certain positions.    Works form home and is sitting all day. Got new mattress, and neck pillow. States she is more comfortable  in her new chair at work.  States she has not been able to wear a tight ponytail abrades for a while now because of pain she also has noticed some jaw symptoms and pressure in her ears.  Reports she still has some bouts of dizziness but does not report any current numbness or tingling in her hands.  Reports both sides are equally affected.  She was working out pretty regularly doing body weight stuff about 2 months ago but has not been able to do it since her symptoms increased.  Her muscle relaxer does help but she does not like to take medication and she has an initial consult with a neurologist on July 24.   Hand dominance: Right  PERTINENT HISTORY:  recent dizziness  PAIN:  Are you having pain? Yes: NPRS scale: 0/10 Pain location: center of neck  Pain description:  achy Aggravating factors: sitting for long periods of time, braids.   Relieving factors: medication, laying on side  PRECAUTIONS: None  RED FLAGS: None     WEIGHT BEARING RESTRICTIONS: No  FALLS:  Has patient fallen in last 6 months? No    OCCUPATION: collections, works from home, moderate stress  PATIENT GOALS: to have less pain   NEXT MD VISIT: neuro July 24th  OBJECTIVE:  Note: Objective measures were completed at Evaluation unless otherwise noted.  DIAGNOSTIC FINDINGS:  06/08/23 FINDINGS: On the lateral radiograph,  C1-C7 are visualized. Mild straightening of the normal cervical lordosis. Vertebral body heights are well maintained without acute fracture. The lateral masses of C1 are well aligned with C2. The visualized odontoid is intact. Intervertebral disc heights are preserved. No significant neural foraminal stenosis. No prevertebral soft tissue swelling. No abnormal translation of the cervical spine with flexion.   IMPRESSION: Mild straightening of the normal cervical lordosis, which may be due to patient positioning or muscular spasm. Otherwise, no acute fracture or malalignment of the cervical spine.  PATIENT SURVEYS:  Patient-specific activity functional scoring scheme (Point to one number):  0 represents "unable to perform." 10 represents "able to perform at prior level. 0 1 2 3 4 5 6 7 8 9  10 (Date and Score) Activity Initial  Activity Eval   6/26  Wearing braids  0  0  Wearing cross body bag 5  5  Working out 0 10   Additional Additional Total score = sum of the activity scores/number of activities Minimum detectable change (90%CI) for average score = 2 points Minimum detectable change (90%CI) for single activity score = 3 points PSFS developed by: Rosalee MYRTIS Marvis KYM Charlet CHRISTELLA., & Binkley, J. (1995). Assessing disability and change on individual  patients: a report of a patient specific measure. Physiotherapy [place], 47, [phone]. Reproduced with the permission of the authors  Score: EVAL: 5/3= 1.66; 6/26: 15/3 =5  POSTURE: rounded shoulders, forward head, and flexed trunk   PALPATION: Increased resting tone and pain with palpation along B cervical paraspinals, UT, pecs, suboccipitals, unable to assess joint mobility due to pain   CERVICAL ROM:   Active ROM A/PROM (deg) 6/26  Flexion WFL*  Extension WFL*  Right lateral flexion   Left lateral flexion   Right rotation 75*  Left rotation 75*   (Blank rows = not tested) *pain    UE Measurements Upper  Extremity Right 6/26 Left 6/26   A/PROM MMT A/PROM MMT  Shoulder Flexion WFL 4+ WFL 4+  Shoulder Extension      Shoulder Abduction WFL 4+  4+  Shoulder  Adduction      Shoulder Internal Rotation WFL 4+ WFL 4+  Shoulder External Rotation WFL 4+ WFL 4+  Elbow Flexion      Elbow Extension      Wrist Flexion      Wrist Extension      Wrist Supination      Wrist Pronation      Wrist Ulnar Deviation      Wrist Radial Deviation      Grip Strength NA  NA     (Blank rows = not tested)   * pain   TREATMENT DATE:                                                                                                                               08/31/2023 Therapeutic Exercise: Supine:   S/L:  Prone: Seated:   Standing:  UBE L2; 2 min fwd, 2 min bwd Neuromuscular Re-education:   Manual Therapy:    STM UT, cervical paraspinals, suboccipitals Therapeutic Activity:  Row green TB 2x10 Shoulder ext green TB 2x10 Shoulder horizontal abd green TB 2x10  Lat pull down green TB 2x10 Self Care:   Trigger Point Dry Needling:  Modalities:     PATIENT EDUCATION:  Education details: on HEP  Person educated: Patient Education method: Programmer, multimedia, Facilities manager, and Handouts Education comprehension: verbalized understanding   HOME EXERCISE PROGRAM: [id number] Body scan, self massage to neck/head/chest muscles  ASSESSMENT:  CLINICAL IMPRESSION:  08/31/2023 *** Ms. Janalee continues to progress well -- no recurrence of pain or issues while driving to [place] . Discussed slow return to gym strengthening using machines. Reviewed different machines in the gym and replicated proper form in clinic today. Pt able to demo good understanding. At this point, goal will be to slowly return to her normal activities and monitor neck symptoms and then adjust/problem solve as needed if pain returns.   Eval: Patient presents to physical therapy with complaints of neck and shoulder pain that started a while ago but  recently increased in intensity of the last 3 weeks.  Patient presents with cervicogenic headache symptoms as well as increased tension throughout cervical musculature.  Educated patient on current finding as well as benefits of physical therapy.  Patient with significant muscle guarding and pain with light palpation.  Encourage patient to perform gentle self massage to help down regulate central nervous system.  Patient would greatly benefit from skilled PT to address physical impairments and return her to optimal function.  OBJECTIVE IMPAIRMENTS: decreased activity tolerance, decreased mobility, decreased ROM, decreased strength, improper body mechanics, postural dysfunction, and pain.     GOALS: Goals reviewed with patient? yes  SHORT TERM GOALS: Target date: 08/08/2023   Patient will be independent in self management strategies to improve quality of life and functional outcomes. Baseline: New Program Goal status: PROGRESSING  2.  Patient will report at least 50% improvement in overall symptoms and/or function to demonstrate improved functional mobility Baseline: 0%  better Goal status: MET  3.  Patient will be able to regularly wear cross body back without increase in symptoms Baseline: Painful Goal status: PROGRESSING    LONG TERM GOALS: Target date: 09/19/2023    Patient will report at least 75% improvement in overall symptoms and/or function to demonstrate improved functional mobility Baseline: 0% better Goal status: PROGRESSING  2.  Patient will score at least 2 points higher on PSFS average to demonstrate change in overall function. Baseline: see above Goal status: MET  3.  Patient will be able to regularly workout without increase in symptoms returning to prior level of function Baseline: Unable Goal status: PROGRESSING     PLAN:  PT FREQUENCY: 1-2x/week for a total to 16 visits over 12 week certification period  PT DURATION: 12 weeks  PLANNED INTERVENTIONS:  97110-Therapeutic exercises, 97530- Therapeutic activity, 97112- Neuromuscular re-education, 97535- Self Care, 02859- Manual therapy, [id number]- Gait training, [phone]- Orthotic Fit/training, [phone]- Canalith repositioning, [id number]- Aquatic Therapy, 97014- Electrical stimulation (unattended), [phone]- Ionotophoresis 4mg /ml Dexamethasone , Patient/Family education, Balance training, Stair training, Taping, Dry Needling, Joint mobilization, Joint manipulation, Spinal manipulation, Spinal mobilization, Cryotherapy, and Moist heat   PLAN FOR NEXT SESSION: cervical STM, DN if indicated/tolerated,migraine management (responded well to blue light glasses), posture/strengthening exercises, continued improved posture/ergonomic set up at work.    7:53 AM, 08/31/23 Azrael Maddix April Ma L Somalia Segler, PT, DPT Physical Therapy with [place]

## 2023-09-07 ENCOUNTER — Encounter: Payer: Self-pay | Admitting: Physical Therapy

## 2023-09-07 ENCOUNTER — Ambulatory Visit: Admitting: Physical Therapy

## 2023-09-07 DIAGNOSIS — M542 Cervicalgia: Secondary | ICD-10-CM

## 2023-09-07 DIAGNOSIS — M6281 Muscle weakness (generalized): Secondary | ICD-10-CM | POA: Diagnosis not present

## 2023-09-07 NOTE — Therapy (Signed)
 OUTPATIENT PHYSICAL THERAPY CERVICAL TREATMENT   Patient Name: Tracey Taylor MRN: [id number] DOB:1970/10/17, 53 y.o., female Today's Date: 09/07/2023  END OF SESSION:  PT End of Session - 09/07/23 0849     Visit Number 11    Number of Visits 16    Date for PT Re-Evaluation 09/19/23    Authorization Type [employer]    Progress Note Due on Visit 18    PT Start Time [id number]    PT Stop Time 0930    PT Time Calculation (min) 41 min    Activity Tolerance Patient limited by pain    Behavior During Therapy [facility] for tasks assessed/performed            Past Medical History:  Diagnosis Date   Headache    Past Surgical History:  Procedure Laterality Date   CERVICAL BIOPSY  W/ LOOP ELECTRODE EXCISION N/A 02/06/2013   CYSTOSCOPY N/A 07/31/2019   Procedure: CYSTOSCOPY;  Surgeon: Sarrah Browning, MD;  Location: [facility];  Service: Gynecology;  Laterality: N/A;   ECTOPIC PREGNANCY SURGERY N/A 1998   tube removed but does not remember which side   ROBOTIC ASSISTED LAPAROSCOPIC HYSTERECTOMY AND SALPINGECTOMY Bilateral 07/31/2019   Procedure: XI ROBOTIC ASSISTED LAPAROSCOPIC HYSTERECTOMY AND SALPINGECTOMY;  Surgeon: Sarrah Browning, MD;  Location: [facility];  Service: Gynecology;  Laterality: Bilateral;   TUBAL LIGATION  2000   Patient Active Problem List   Diagnosis Date Noted   Allergic reaction of correct medicinal substance properly administered 07/12/2023   Intramural leiomyoma of uterus 04/04/2019   Pancreatic cyst 04/04/2019   Anemia 04/04/2019   Mixed stress and urge urinary incontinence 04/04/2019   Pain in left knee 07/25/2018   Tension headache 03/21/2016   Migraine variant, intractable 03/21/2016   Vertigo 03/21/2016    PCP: Job Lukes, PA  REFERRING PROVIDER: Joane Artist RAMAN, MD  REFERRING DIAG: M54.2 (ICD-10-CM) - Cervicalgia   THERAPY DIAG:  Cervicalgia  Muscle weakness (generalized)  Rationale for Evaluation and  Treatment: Rehabilitation  ONSET DATE: years, 3 weeks ago last episode  SUBJECTIVE:                                                                                                                                                                                                         SUBJECTIVE STATEMENT:  09/07/2023 Pt states she is back to all normal activities. Back to the gym and using the peloton.    Eval: States she has had neck pain for years. States it has gotten more intense in the middle  a few weeks ago. States that three weeks ago she started having some dizziness as well.  States that her shoulders also bother her. States that her cross body bag also aggravates her. States she was having some pressure in her chest if she lays on her back or sitting up in certain positions.    Works form home and is sitting all day. Got new mattress, and neck pillow. States she is more comfortable in her new chair at work.  States she has not been able to wear a tight ponytail abrades for a while now because of pain she also has noticed some jaw symptoms and pressure in her ears.  Reports she still has some bouts of dizziness but does not report any current numbness or tingling in her hands.  Reports both sides are equally affected.  She was working out pretty regularly doing body weight stuff about 2 months ago but has not been able to do it since her symptoms increased.  Her muscle relaxer does help but she does not like to take medication and she has an initial consult with a neurologist on July 24.   Hand dominance: Right  PERTINENT HISTORY:  recent dizziness  PAIN:  Are you having pain? Yes: NPRS scale: 0/10 Pain location: center of neck  Pain description:  achy Aggravating factors: sitting for long periods of time, braids.   Relieving factors: medication, laying on side  PRECAUTIONS: None  RED FLAGS: None     WEIGHT BEARING RESTRICTIONS: No  FALLS:  Has patient fallen in last 6  months? No    OCCUPATION: collections, works from home, moderate stress  PATIENT GOALS: to have less pain   NEXT MD VISIT: neuro July 24th  OBJECTIVE:  Note: Objective measures were completed at Evaluation unless otherwise noted.  DIAGNOSTIC FINDINGS:  06/08/23 FINDINGS: On the lateral radiograph, C1-C7 are visualized. Mild straightening of the normal cervical lordosis. Vertebral body heights are well maintained without acute fracture. The lateral masses of C1 are well aligned with C2. The visualized odontoid is intact. Intervertebral disc heights are preserved. No significant neural foraminal stenosis. No prevertebral soft tissue swelling. No abnormal translation of the cervical spine with flexion.   IMPRESSION: Mild straightening of the normal cervical lordosis, which may be due to patient positioning or muscular spasm. Otherwise, no acute fracture or malalignment of the cervical spine.  PATIENT SURVEYS:  Patient-specific activity functional scoring scheme (Point to one number):  0 represents "unable to perform." 10 represents "able to perform at prior level. 0 1 2 3 4 5 6 7 8 9  10 (Date and Score) Activity Initial  Activity Eval   6/26 7/31  Wearing braids  0  0 N/a  Wearing cross body bag 5  5 7   Working out 0 10 10   Additional Additional Total score = sum of the activity scores/number of activities Minimum detectable change (90%CI) for average score = 2 points Minimum detectable change (90%CI) for single activity score = 3 points PSFS developed by: Rosalee MYRTIS Marvis KYM Charlet CHRISTELLA., & Binkley, J. (1995). Assessing disability and change on individual  patients: a report of a patient specific measure. Physiotherapy [place], 47, [phone]. Reproduced with the permission of the authors  Score: EVAL: 5/3= 1.66; 6/26: 15/3 =5;   POSTURE: rounded shoulders, forward head, and flexed trunk   PALPATION: Increased resting tone and pain with palpation along B  cervical paraspinals, UT, pecs, suboccipitals, unable to assess joint mobility due to pain  CERVICAL ROM:   Active ROM A/PROM (deg) 6/26 09/07/23  Flexion WFL* WFL pulling in subocc  Extension WFL* WFL  Right lateral flexion    Left lateral flexion    Right rotation 75* 80  Left rotation 75* 80   (Blank rows = not tested) *pain    UE Measurements Upper Extremity Right 6/26 Left 6/26 R/L 7/31   A/PROM MMT A/PROM MMT MMT  Shoulder Flexion WFL 4+ WFL 4+ 5/5  Shoulder Extension       Shoulder Abduction WFL 4+  4+ 5/5  Shoulder Adduction       Shoulder Internal Rotation WFL 4+ WFL 4+ 5/5  Shoulder External Rotation WFL 4+ WFL 4+ 5/5  Elbow Flexion       Elbow Extension       Wrist Flexion       Wrist Extension       Wrist Supination       Wrist Pronation       Wrist Ulnar Deviation       Wrist Radial Deviation       Grip Strength NA  NA      (Blank rows = not tested)   * pain   TREATMENT DATE:                                                                                                                               09/07/2023 Therapeutic Exercise: Supine:   S/L:  Prone: Seated:  Rechecked goals Standing:  Neuromuscular Re-education:   Manual Therapy:    Therapeutic Activity:  Cervical retraction performed with: shoulder flex above head 3# x10, shoulder abd overhead press 3# x10, combined motions of fly->shoulder flexion overhead->goal post 3# x10, combined motion of goal post ->shoulder abd overhead->overhead fly 3# x10 Self Care:   Trigger Point Dry Needling:  Modalities:     PATIENT EDUCATION:  Education details: on HEP  Person educated: Patient Education method: Programmer, multimedia, Facilities manager, and Handouts Education comprehension: verbalized understanding   HOME EXERCISE PROGRAM: [id number] Body scan, self massage to neck/head/chest muscles  ASSESSMENT:  CLINICAL IMPRESSION:  09/07/2023 Treatment focused on rechecking pt's goals for d/c. Pt has met or  partially met all of her STG and LTGs. She still has not returned to full overhead strengthening exercises in the gym due to her concerns about causing increased neck pain. Session focused on body mechanics, form, and education on PRE to keep her cervical muscles from overcompensating and using her delts/midback with overhead gym activities. Pt able to demo good understanding and feels ready for d/c.   Eval: Patient presents to physical therapy with complaints of neck and shoulder pain that started a while ago but recently increased in intensity of the last 3 weeks.  Patient presents with cervicogenic headache symptoms as well as increased tension throughout cervical musculature.  Educated patient on current finding as well as benefits of physical therapy.  Patient with significant muscle guarding and pain  with light palpation.  Encourage patient to perform gentle self massage to help down regulate central nervous system.  Patient would greatly benefit from skilled PT to address physical impairments and return her to optimal function.  OBJECTIVE IMPAIRMENTS: decreased activity tolerance, decreased mobility, decreased ROM, decreased strength, improper body mechanics, postural dysfunction, and pain.     GOALS: Goals reviewed with patient? yes  SHORT TERM GOALS: Target date: 08/08/2023   Patient will be independent in self management strategies to improve quality of life and functional outcomes. Baseline: New Program Goal status: MET  2.  Patient will report at least 50% improvement in overall symptoms and/or function to demonstrate improved functional mobility Baseline: 0% better Goal status: MET  3.  Patient will be able to regularly wear cross body back without increase in symptoms Baseline: Painful 09/07/23: Able to wear fanny pack cross body Goal status: PARTIALLY MET    LONG TERM GOALS: Target date: 09/19/2023    Patient will report at least 75% improvement in overall symptoms and/or  function to demonstrate improved functional mobility Baseline: 0% better 09/07/23: 75% Goal status: MET  2.  Patient will score at least 2 points higher on PSFS average to demonstrate change in overall function. Baseline: see above Goal status: MET  3.  Patient will be able to regularly workout without increase in symptoms returning to prior level of function Baseline: Unable 09/07/23: Has returned to regular workouts without issues Goal status: MET     PLAN:  PT FREQUENCY: 1-2x/week for a total to 16 visits over 12 week certification period  PT DURATION: 12 weeks  PLANNED INTERVENTIONS: 97110-Therapeutic exercises, 97530- Therapeutic activity, 97112- Neuromuscular re-education, 97535- Self Care, 02859- Manual therapy, [phone]- Gait training, [phone]- Orthotic Fit/training, [phone]- Canalith repositioning, [id number]- Aquatic Therapy, 97014- Electrical stimulation (unattended), [phone]- Ionotophoresis 4mg /ml Dexamethasone , Patient/Family education, Balance training, Stair training, Taping, Dry Needling, Joint mobilization, Joint manipulation, Spinal manipulation, Spinal mobilization, Cryotherapy, and Moist heat   PLAN FOR NEXT SESSION: cervical STM, DN if indicated/tolerated,migraine management (responded well to blue light glasses), posture/strengthening exercises, continued improved posture/ergonomic set up at work.    8:49 AM, 09/07/23 Adamary Savary April Ma L Manson [place], PT, DPT Physical Therapy with [place]

## 2023-10-20 ENCOUNTER — Ambulatory Visit (INDEPENDENT_AMBULATORY_CARE_PROVIDER_SITE_OTHER): Admitting: Physician Assistant

## 2023-10-20 ENCOUNTER — Encounter: Payer: Self-pay | Admitting: Physician Assistant

## 2023-10-20 VITALS — BP 110/80 | HR 72 | Temp 98.0°F | Ht 67.0 in | Wt 198.0 lb

## 2023-10-20 DIAGNOSIS — E669 Obesity, unspecified: Secondary | ICD-10-CM | POA: Diagnosis not present

## 2023-10-20 DIAGNOSIS — G4489 Other headache syndrome: Secondary | ICD-10-CM | POA: Diagnosis not present

## 2023-10-20 DIAGNOSIS — Z0000 Encounter for general adult medical examination without abnormal findings: Secondary | ICD-10-CM | POA: Diagnosis not present

## 2023-10-20 LAB — LIPID PANEL
Cholesterol: 185 mg/dL (ref 0–200)
HDL: 59.1 mg/dL (ref >39.00)
LDL Cholesterol: 109 mg/dL — ABNORMAL HIGH (ref 0–99)
NonHDL: 125.41
Total CHOL/HDL Ratio: 3
Triglycerides: 84 mg/dL (ref 0.0–149.0)
VLDL: 16.8 mg/dL (ref 0.0–40.0)

## 2023-10-20 LAB — CBC WITH DIFFERENTIAL/PLATELET
Basophils Absolute: 0 K/uL (ref 0.0–0.1)
Basophils Relative: 0.3 % (ref 0.0–3.0)
Eosinophils Absolute: 0.1 K/uL (ref 0.0–0.7)
Eosinophils Relative: 1.3 % (ref 0.0–5.0)
HCT: 43.2 % (ref 36.0–46.0)
Hemoglobin: 15.1 g/dL — ABNORMAL HIGH (ref 12.0–15.0)
Lymphocytes Relative: 40 % (ref 12.0–46.0)
Lymphs Abs: 1.7 K/uL (ref 0.7–4.0)
MCHC: 34.9 g/dL (ref 30.0–36.0)
MCV: 82 fl (ref 78.0–100.0)
Monocytes Absolute: 0.4 K/uL (ref 0.1–1.0)
Monocytes Relative: 9.5 % (ref 3.0–12.0)
Neutro Abs: 2.1 K/uL (ref 1.4–7.7)
Neutrophils Relative %: 48.9 % (ref 43.0–77.0)
Platelets: 173 K/uL (ref 150.0–400.0)
RBC: 5.27 Mil/uL — ABNORMAL HIGH (ref 3.87–5.11)
RDW: 13 % (ref 11.5–15.5)
WBC: 4.3 K/uL (ref 4.0–10.5)

## 2023-10-20 NOTE — Progress Notes (Signed)
 Subjective:    Tracey Taylor is a 53 y.o. female and is here for a comprehensive physical exam.  HPI  Health Maintenance Due  Topic Date Due   Mammogram  11/08/2023    Discussed the use of AI scribe software for clinical note transcription with the patient, who gave verbal consent to proceed.  History of Present Illness Tracey Taylor is a 53 year old female who presents for a Comprehensive Physical Exam (CPE) preventive care annual visit   She reports significant improvement in her neck pain after completing physical therapy. She has not taken Topamax  for three weeks and has not experienced any headaches.  She is concerned about her weight, as she has not lost weight despite regular exercise and a healthy diet over the past two months. She engages in high-intensity interval training and bodyweight exercises but is frustrated by the lack of weight change.  She sleeps well, with occasional periods of wakefulness, which she attributes to her exercise routine. She has abstained from alcohol and does not smoke.  She is up to date on her colonoscopy but needs to schedule a mammogram and Pap smear.    Health Maintenance: Immunizations -- UpToDate  Colonoscopy -- UpToDate  Mammogram -- needs to schedule mammo PAP -- overdue Bone Density -- none Diet -- overall healthy Exercise -- regularly performs HIIT exercises  Sleep habits -- no major issues Mood -- good  UTD with dentist? - utd UTD with eye doctor? - utd  Weight history: Wt Readings from Last 10 Encounters:  10/20/23 198 lb (89.8 kg)  07/04/23 200 lb (90.7 kg)  06/14/23 199 lb (90.3 kg)  06/08/23 198 lb 8 oz (90 kg)  10/19/22 185 lb 8 oz (84.1 kg)  05/11/22 188 lb 8 oz (85.5 kg)  10/25/21 186 lb 2 oz (84.4 kg)  07/31/19 193 lb 1.6 oz (87.6 kg)  07/24/19 197 lb (89.4 kg)  04/04/19 195 lb 8 oz (88.7 kg)   Body mass index is 31.01 kg/m. Patient's last menstrual period was  07/05/2019.  Alcohol use:  reports that she does not currently use alcohol after a past usage of about 1.0 standard drink of alcohol per week.  Tobacco use:  Tobacco Use: Low Risk  (10/20/2023)   Patient History    Smoking Tobacco Use: Never    Smokeless Tobacco Use: Never    Passive Exposure: Not on file   Eligible for lung cancer screening? no     10/20/2023    8:34 AM  Depression screen PHQ 2/9  Decreased Interest 0  Down, Depressed, Hopeless 0  PHQ - 2 Score 0     Other providers/specialists: Patient Care Team: Job Lukes, [place] as PCP - General (Physician Assistant)    PMHx, SurgHx, SocialHx, Medications, and Allergies were reviewed in the Visit Navigator and updated as appropriate.   Past Medical History:  Diagnosis Date   Allergy    Headache      Past Surgical History:  Procedure Laterality Date   ABDOMINAL HYSTERECTOMY  07/2019   CERVICAL BIOPSY  W/ LOOP ELECTRODE EXCISION N/A 02/06/2013   CYSTOSCOPY N/A 07/31/2019   Procedure: CYSTOSCOPY;  Surgeon: Sarrah Browning, MD;  Location: [facility];  Service: Gynecology;  Laterality: N/A;   ECTOPIC PREGNANCY SURGERY N/A 1998   tube removed but does not remember which side   ROBOTIC ASSISTED LAPAROSCOPIC HYSTERECTOMY AND SALPINGECTOMY Bilateral 07/31/2019   Procedure: XI ROBOTIC ASSISTED LAPAROSCOPIC HYSTERECTOMY AND SALPINGECTOMY;  Surgeon: Sarrah Browning,  MD;  Location: [facility];  Service: Gynecology;  Laterality: Bilateral;   TUBAL LIGATION  2000     Family History  Problem Relation Age of Onset   Diabetes Mother    Hypertension Mother    Kidney disease Mother    Stroke Mother    Liver disease Father    Diabetes Maternal Aunt     Social History   Tobacco Use   Smoking status: Never   Smokeless tobacco: Never  Vaping Use   Vaping status: Never Used  Substance Use Topics   Alcohol use: Not Currently    Alcohol/week: 1.0 standard drink of alcohol    Types: 1  Glasses of wine per week    Comment: socially   Drug use: No    Review of Systems:   Review of Systems  Constitutional:  Negative for chills, fever, malaise/fatigue and weight loss.  HENT:  Negative for hearing loss, sinus pain and sore throat.   Respiratory:  Negative for cough and hemoptysis.   Cardiovascular:  Negative for chest pain, palpitations, leg swelling and PND.  Gastrointestinal:  Negative for abdominal pain, constipation, diarrhea, heartburn, nausea and vomiting.  Genitourinary:  Negative for dysuria, frequency and urgency.  Musculoskeletal:  Negative for back pain, myalgias and neck pain.  Skin:  Negative for itching and rash.  Neurological:  Negative for dizziness, tingling, seizures and headaches.  Endo/Heme/Allergies:  Negative for polydipsia.  Psychiatric/Behavioral:  Negative for depression. The patient is not nervous/anxious.     Objective:   BP 110/80 (BP Location: Left Arm, Patient Position: Sitting, Cuff Size: Large)   Pulse 72   Temp 98 F (36.7 C) (Temporal)   Ht 5' 7 (1.702 m)   Wt 198 lb (89.8 kg)   LMP 07/05/2019   BMI 31.01 kg/m  Body mass index is 31.01 kg/m.   General Appearance:    Alert, cooperative, no distress, appears stated age  Head:    Normocephalic, without obvious abnormality, atraumatic  Eyes:    PERRL, conjunctiva/corneas clear, EOM's intact, fundi    benign, both eyes  Ears:    Normal TM's and external ear canals, both ears  Nose:   Nares normal, septum midline, mucosa normal, no drainage    or sinus tenderness  Throat:   Lips, mucosa, and tongue normal; teeth and gums normal  Neck:   Supple, symmetrical, trachea midline, no adenopathy;    thyroid :  no enlargement/tenderness/nodules; no carotid   bruit or JVD  Back:     Symmetric, no curvature, ROM normal, no CVA tenderness  Lungs:     Clear to auscultation bilaterally, respirations unlabored  Chest Wall:    No tenderness or deformity   Heart:    Regular rate and rhythm, S1  and S2 normal, no murmur, rub or gallop  Breast Exam:    Deferred  Abdomen:     Soft, non-tender, bowel sounds active all four quadrants,    no masses, no organomegaly  Genitalia:    Deferred   Extremities:   Extremities normal, atraumatic, no cyanosis or edema  Pulses:   2+ and symmetric all extremities  Skin:   Skin color, texture, turgor normal, no rashes or lesions  Lymph nodes:   Cervical, supraclavicular, and axillary nodes normal  Neurologic:   CNII-XII intact, normal strength, sensation and reflexes    throughout    Assessment/Plan:   Assessment and Plan Assessment & Plan Adult Wellness Visit Generally well with improved mental health and sleep from  regular exercise. Up to date on colonoscopy. Healthy diet and exercise without weight loss. No alcohol or smoking. - Encourage scheduling mammogram and Pap smear. - Continue regular dental and eye exams. - Encourage continued healthy eating and exercise. - Check cholesterol levels.  Obesity Frustrated with lack of weight loss despite healthy lifestyle. Not interested in injections. Awaiting new oral weight loss medication expected around Christmas, potentially suitable due to fewer side effects and lower cost. - Follow up in January to discuss new oral weight loss medication. - Encourage continued exercise and healthy eating.  Other headache(s) syndrome Stopped Topamax  for three weeks with no headaches. - Monitor headache symptoms without Topamax . - Has neurology on board    Lucie Buttner, PA-C [facility] [place]

## 2023-10-23 ENCOUNTER — Ambulatory Visit: Payer: Self-pay | Admitting: Physician Assistant

## 2023-12-07 ENCOUNTER — Telehealth: Payer: Self-pay

## 2023-12-07 NOTE — Telephone Encounter (Signed)
 Papers were sent to patients provider for review and next steps if applicable.

## 2023-12-07 NOTE — Telephone Encounter (Signed)
-----   Message from [place] M sent at 12/07/2023  4:01 PM EDT ----- Please review.

## 2024-01-15 ENCOUNTER — Telehealth: Payer: Self-pay | Admitting: Neurology

## 2024-01-15 ENCOUNTER — Ambulatory Visit: Admitting: Neurology

## 2024-01-15 NOTE — Telephone Encounter (Signed)
Patient reschedule appointment due to bad weather

## 2024-01-16 ENCOUNTER — Ambulatory Visit: Admitting: Neurology

## 2024-01-16 ENCOUNTER — Encounter: Payer: Self-pay | Admitting: Neurology

## 2024-01-16 VITALS — BP 128/93 | HR 62 | Ht 67.0 in | Wt 196.4 lb

## 2024-01-16 DIAGNOSIS — R42 Dizziness and giddiness: Secondary | ICD-10-CM

## 2024-01-16 DIAGNOSIS — Z8669 Personal history of other diseases of the nervous system and sense organs: Secondary | ICD-10-CM

## 2024-01-16 DIAGNOSIS — M542 Cervicalgia: Secondary | ICD-10-CM

## 2024-01-16 NOTE — Patient Instructions (Signed)
 I had a long discussion with the patient regarding her symptoms of dizziness which appear to have resolved and discussed results of imaging studies and heart monitor and answered questions.  I recommend she do regular neck stretching exercises and improve her posture for her musculoskeletal neck pain.  Continue to avoid triggers for migraines and use Topamax  as needed.  Return for follow-up in the future only as necessary and no scheduled appointment was made.  Neck Exercises Ask your health care provider which exercises are safe for you. Do exercises exactly as told by your health care provider and adjust them as directed. It is normal to feel mild stretching, pulling, tightness, or discomfort as you do these exercises. Stop right away if you feel sudden pain or your pain gets worse. Do not begin these exercises until told by your health care provider. Neck exercises can be important for many reasons. They can improve strength and maintain flexibility in your neck, which will help your upper back and prevent neck pain. Stretching exercises Rotation neck stretching  Sit in a chair or stand up. Place your feet flat on the floor, shoulder-width apart. Slowly turn your head (rotate) to the right until a slight stretch is felt. Turn it all the way to the right so you can look over your right shoulder. Do not tilt or tip your head. Hold this position for 10-30 seconds. Slowly turn your head (rotate) to the left until a slight stretch is felt. Turn it all the way to the left so you can look over your left shoulder. Do not tilt or tip your head. Hold this position for 10-30 seconds. Repeat __________ times. Complete this exercise __________ times a day. Neck retraction  Sit in a sturdy chair or stand up. Look straight ahead. Do not bend your neck. Use your fingers to push your chin backward (retraction). Do not bend your neck for this movement. Continue to face straight ahead. If you are doing the  exercise properly, you will feel a slight sensation in your throat and a stretch at the back of your neck. Hold the stretch for 1-2 seconds. Repeat __________ times. Complete this exercise __________ times a day. Strengthening exercises Neck press  Lie on your back on a firm bed or on the floor with a pillow under your head. Use your neck muscles to push your head down on the pillow and straighten your spine. Hold the position as well as you can. Keep your head facing up (in a neutral position) and your chin tucked. Slowly count to 5 while holding this position. Repeat __________ times. Complete this exercise __________ times a day. Isometrics These are exercises in which you strengthen the muscles in your neck while keeping your neck still (isometrics). Sit in a supportive chair and place your hand on your forehead. Keep your head and face facing straight ahead. Do not flex or extend your neck while doing isometrics. Push forward with your head and neck while pushing back with your hand. Hold for 10 seconds. Do the sequence again, this time putting your hand against the back of your head. Use your head and neck to push backward against the hand pressure. Finally, do the same exercise on either side of your head, pushing sideways against the pressure of your hand. Repeat __________ times. Complete this exercise __________ times a day. Prone head lifts  Lie face-down (prone position), resting on your elbows so that your chest and upper back are raised. Start with your head  facing downward, near your chest. Position your chin either on or near your chest. Slowly lift your head upward. Lift until you are looking straight ahead. Then continue lifting your head as far back as you can comfortably stretch. Hold your head up for 5 seconds. Then slowly lower it to your starting position. Repeat __________ times. Complete this exercise __________ times a day. Supine head lifts  Lie on your back  (supine position), bending your knees to point to the ceiling and keeping your feet flat on the floor. Lift your head slowly off the floor, raising your chin toward your chest. Hold for 5 seconds. Repeat __________ times. Complete this exercise __________ times a day. Scapular retraction  Stand with your arms at your sides. Look straight ahead. Slowly pull both shoulders (scapulae) backward and downward (retraction) until you feel a stretch between your shoulder blades in your upper back. Hold for 10-30 seconds. Relax and repeat. Repeat __________ times. Complete this exercise __________ times a day. Contact a health care provider if: Your neck pain or discomfort gets worse when you do an exercise. Your neck pain or discomfort does not improve within 2 hours after you exercise. If you have any of these problems, stop exercising right away. Do not do the exercises again unless your health care provider says that you can. Get help right away if: You develop sudden, severe neck pain. If this happens, stop exercising right away. Do not do the exercises again unless your health care provider says that you can. This information is not intended to replace advice given to you by your health care provider. Make sure you discuss any questions you have with your health care provider. Document Revised: 07/21/2020 Document Reviewed: 07/21/2020 Elsevier Patient Education  2024 [employer].

## 2024-01-16 NOTE — Progress Notes (Signed)
 [facility] [address] [place]. [place] [address] [phone]       OFFICE CONSULT NOTE  [name] Date of Birth:  1970-10-10 Medical Record Number:  [id number]   Referring MD: Lucie Buttner, PA  Reason for Referral: Lightheadedness  HPI: Initial visit 07/04/2023 [name] is a 53 year old pleasant African-American lady seen today for initial office consultation visit.  History is obtained from the patient and review of electronic medical records and I personally reviewed pertinent available imaging films in PACS.  Patient has no significant past medical history except chronic neck pain.  She states for the last year or 2 she has had some posterior neck pain which she is describes mostly as muscle tightness and stiffness.  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  Over the last month or 2 she is had episodes of nausea and lightheadedness.  She got up the elevator once and felt as if she was still moving after taking a few steps.  She was able to catch herself and did not fall.  She denies any true vertigo, loss of vision, double vision, extremity weakness numbness gait or balance problems.  She feels occasionally she is about to fall and needs to sit down.  She does admit to occasional skipping heartbeat but denies palpitations fainting episodes.  She feels a couple of times she felt she was about to pass out but was able to sit down and that feeling passed.  She does complain of posterior neck pain and muscle tightness and her primary care physician recently put her on Zanaflex  which seems to be working.  She had some x-rays of neck and EKG fine but has not had any brain imaging was done.  She denies any prior history of strokes TIA seizures migraines or significant neurological problems. She had orthostatic vitals checked in office and complained of lightheadedness but did not have significant drop in  blood pressure Update 01/16/2024 : She returns for follow-up after last visit 6 months ago.  She states she is doing well.  She is no longer having any episodes of lightheadedness or dizziness.  She does undergo MRI scan of the brain on which was normal.  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  30-day heart monitor was also negative for paroxysmal A-fib.  Patient continues to have pain.  She has not been doing regular neck exercises.  She admits that she has to sit for long hours at work and her posture is not good.  She has a long lasting history of migraine headaches which are present currently frequent and occurs once every couple of months.  Topamax  as needed this seems to help.  She has no new complaints. ROS:   14 system review of systems is positive for neck pain, muscle tightness, lightheadedness, dizziness, nausea, and palpitations all other systems negative  PMH:  Past Medical History:  Diagnosis Date   Allergy    Headache     Social History:  Social History   Socioeconomic History   Marital status: Divorced    Spouse name: Not on file   Number of children: Not on file   Years of education: Not on file   Highest education level: 12th grade  Occupational History   Not on file  Tobacco Use   Smoking status: Never   Smokeless tobacco:  Never  Vaping Use   Vaping status: Never Used  Substance and Sexual Activity   Alcohol use: Not Currently    Alcohol/week: 1.0 standard drink of alcohol    Types: 1 Glasses of wine per week    Comment: socially   Drug use: No   Sexual activity: Yes    Partners: Male    Birth control/protection: None, Surgical  Other Topics Concern   Not on file  Social History Narrative   Not on file   Social Drivers of Health   Financial Resource Strain: Low Risk  (10/18/2023)   Overall Financial Resource Strain (CARDIA)    Difficulty of Paying Living Expenses:  Not hard at all  Food Insecurity: No Food Insecurity (10/18/2023)   Hunger Vital Sign    Worried About Running Out of Food in the Last Year: Never true    Ran Out of Food in the Last Year: Never true  Transportation Needs: No Transportation Needs (10/18/2023)   PRAPARE - Administrator, Civil Service (Medical): No    Lack of Transportation (Non-Medical): No  Physical Activity: Sufficiently Active (10/18/2023)   Exercise Vital Sign    Days of Exercise per Week: 4 days    Minutes of Exercise per Session: 60 min  Stress: Stress Concern Present (10/18/2023)   [employer] of Occupational Health - Occupational Stress Questionnaire    Feeling of Stress: To some extent  Social Connections: Moderately Isolated (10/18/2023)   Social Connection and Isolation Panel    Frequency of Communication with Friends and Family: More than three times a week    Frequency of Social Gatherings with Friends and Family: Once a week    Attends Religious Services: Patient declined    Database Administrator or Organizations: No    Attends Engineer, Structural: Not on file    Marital Status: Living with partner  Intimate Partner Violence: Unknown (05/13/2021)   Received from [facility]   HITS    Physically Hurt: Not on file    Insult or Talk Down To: Not on file    Threaten Physical Harm: Not on file    Scream or Curse: Not on file    Medications:   Current Outpatient Medications on File Prior to Visit  Medication Sig Dispense Refill   cyanocobalamin (VITAMIN B12) 1000 MCG tablet Take 1,000 mcg by mouth daily.     ibuprofen  (ADVIL ) 200 MG tablet Take 400 mg by mouth every 6 (six) hours as needed.     topiramate  (TOPAMAX ) 25 MG tablet Take 1 tablet (25 mg total) by mouth 2 (two) times daily. 120 tablet 3   Vitamin D, Cholecalciferol, 25 MCG (1000 UT) CAPS Take 1 capsule by mouth daily in the afternoon.     cetirizine (ZYRTEC) 10 MG tablet Take 10 mg by mouth daily. (Patient not taking:  Reported on 01/16/2024)     No current facility-administered medications on file prior to visit.    Allergies:   Allergies  Allergen Reactions   Gadolinium Derivatives Hives and Itching    Itching and one hive on right deltoid area.     Iodinated Contrast Media Rash    Physical Exam General: well developed, well nourished, seated, in no evident distress Head: head normocephalic and atraumatic.   Neck: supple with no carotid or supraclavicular bruits Cardiovascular: regular rate and rhythm, no murmurs Musculoskeletal: no deformity.  Mild tightness of posterior neck muscles.  No restriction of movements. Skin:  no rash/petichiae  Vascular:  Normal pulses all extremities    01/16/2024    1:12 PM 10/20/2023    8:33 AM 07/04/2023    8:32 AM  Vitals with BMI  Height 5' 7 5' 7 5' 7  Weight 196 lbs 6 oz 198 lbs 200 lbs  BMI 30.75 31 31.32  Systolic 128 110 864  Diastolic 93 80 96  Pulse 62 72 87    Neurologic Exam Mental Status: Awake and fully alert. Oriented to place and time. Recent and remote memory intact. Attention span, concentration and fund of knowledge appropriate. Mood and affect appropriate.  Cranial Nerves: Fundoscopic exam not done today. Pupils equal, briskly reactive to light. Extraocular movements full without nystagmus. Visual fields full to confrontation. Hearing intact. Facial sensation intact. Face, tongue, palate moves normally and symmetrically.  Motor: Normal bulk and tone. Normal strength in all tested extremity muscles. Sensory.: intact to touch , pinprick , position and vibratory sensation.  Coordination: Rapid alternating movements normal in all extremities. Finger-to-nose and heel-to-shin performed accurately bilaterally. Gait and Station: Arises from chair without difficulty. Stance is normal. Gait demonstrates normal stride length and balance . Able to heel, toe and tandem walk with slight difficulty.  Reflexes: 1+ and symmetric. Toes downgoing.       ASSESSMENT: 53 year old African-American lady with symptoms of subjective lightheadedness and dizziness of unclear etiology which need further workup.  Chronic posterior neck pain likely musculoskeletal in nature.  History of longstanding migraine headaches which appear stable     PLAN:I had a long discussion with the patient regarding her symptoms of dizziness which appear to have resolved and discussed results of imaging studies and heart monitor and answered questions.  I recommend she do regular neck stretching exercises and improve her posture for her musculoskeletal neck pain.  Continue to avoid triggers for migraines and use Topamax  as needed.  Return for follow-up in the future only as necessary and no scheduled appointment was made.   I personally spent a total of 35 minutes in the care of the patient today including getting/reviewing separately obtained history, performing a medically appropriate exam/evaluation, counseling and educating, placing orders, referring and communicating with other health care professionals, documenting clinical information in the EHR, independently interpreting results, and coordinating care.        Eather Popp, MD Note: This document was prepared with digital dictation and possible smart phrase technology. Any transcriptional errors that result from this process are unintentional.

## 2024-10-21 ENCOUNTER — Encounter: Admitting: Physician Assistant
